# Patient Record
Sex: Male | Born: 1955 | Race: White | Hispanic: No | Marital: Married | Smoking: Former smoker
Health system: Southern US, Community
[De-identification: ages and names within clinical notes are randomized; demographics above are authoritative.]

## PROBLEM LIST (undated history)

## (undated) DIAGNOSIS — I219 Acute myocardial infarction, unspecified: Secondary | ICD-10-CM

## (undated) DIAGNOSIS — S43432D Superior glenoid labrum lesion of left shoulder, subsequent encounter: Secondary | ICD-10-CM

## (undated) DIAGNOSIS — E7800 Pure hypercholesterolemia, unspecified: Secondary | ICD-10-CM

## (undated) HISTORY — PX: CARDIAC CATHETERIZATION: SHX172

## (undated) HISTORY — PX: FRACTURE SURGERY: SHX138

---

## 2001-05-30 ENCOUNTER — Ambulatory Visit (HOSPITAL_BASED_OUTPATIENT_CLINIC_OR_DEPARTMENT_OTHER): Admission: RE | Admit: 2001-05-30 | Discharge: 2001-05-30 | Payer: Self-pay | Admitting: Orthopedic Surgery

## 2002-05-30 ENCOUNTER — Encounter: Payer: Self-pay | Admitting: Orthopedic Surgery

## 2002-05-30 ENCOUNTER — Ambulatory Visit (HOSPITAL_COMMUNITY): Admission: RE | Admit: 2002-05-30 | Discharge: 2002-05-30 | Payer: Self-pay | Admitting: Orthopedic Surgery

## 2002-09-24 ENCOUNTER — Emergency Department (HOSPITAL_COMMUNITY): Admission: EM | Admit: 2002-09-24 | Discharge: 2002-09-24 | Payer: Self-pay | Admitting: Emergency Medicine

## 2004-10-28 ENCOUNTER — Observation Stay (HOSPITAL_COMMUNITY): Admission: EM | Admit: 2004-10-28 | Discharge: 2004-10-29 | Payer: Self-pay | Admitting: Emergency Medicine

## 2004-11-18 ENCOUNTER — Inpatient Hospital Stay (HOSPITAL_BASED_OUTPATIENT_CLINIC_OR_DEPARTMENT_OTHER): Admission: RE | Admit: 2004-11-18 | Discharge: 2004-11-18 | Payer: Self-pay | Admitting: Cardiology

## 2004-12-16 ENCOUNTER — Encounter: Admission: RE | Admit: 2004-12-16 | Discharge: 2004-12-16 | Payer: Self-pay | Admitting: Neurology

## 2013-07-15 ENCOUNTER — Ambulatory Visit: Payer: Self-pay | Admitting: Family Medicine

## 2013-07-15 VITALS — BP 120/80 | HR 85 | Temp 99.2°F | Resp 16 | Ht 74.0 in | Wt 231.0 lb

## 2013-07-15 DIAGNOSIS — L03317 Cellulitis of buttock: Secondary | ICD-10-CM

## 2013-07-15 DIAGNOSIS — L0231 Cutaneous abscess of buttock: Secondary | ICD-10-CM

## 2013-07-15 DIAGNOSIS — M7918 Myalgia, other site: Secondary | ICD-10-CM

## 2013-07-15 DIAGNOSIS — IMO0001 Reserved for inherently not codable concepts without codable children: Secondary | ICD-10-CM

## 2013-07-15 MED ORDER — DOXYCYCLINE HYCLATE 100 MG PO TABS: 100.0000 mg | ORAL_TABLET | Freq: Two times a day (BID) | ORAL | Status: DC

## 2013-07-15 NOTE — Progress Notes (Signed)
Patient ID: Malaquias Lenker MRN: [id number], DOB: 06-21-1955, 58 y.o. Date of Encounter: 07/15/2013, 11:17 AM    PROCEDURE NOTE: Verbal consent obtained. Alcohol prep.  Local anesthesia obtained with 2% lidocaine plain.   18 G needle used to aspirate small amount of sero-purulent drainage.  1 cm incision made with 11 blade along lesion.  Culture taken. No purulence expressed. Lesion explored revealing no loculations. Packed with 1/4 plain packing.  Dressed. Wound care instructions including precautions with patient. Patient tolerated the procedure well. Recheck in 48 hours     Signed, Georgiann Mccoy, PA-C 07/15/2013 11:17 AM

## 2013-07-15 NOTE — Progress Notes (Signed)
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 His hotel room at air conditioning problems and he developed a small pimple on his right gluteal region. This became larger and more painful. He had an associated fever and chills.  Patient started taking Levaquin and eventually on the trip home, the abscess drained. Subsequently ran out of antibiotic and the abscess reaccumulated despite his manipulation and best efforts to get it to drain. It's now gone back to the original size it is quite painful.  Objective: Patient in no acute distress Examination of the right gluteal fold reveals a 5 cm erythematous indurated mass with central block eschar  Please see PA note regarding I&D   assessment. Gluteal abscess, most likely staph  Plan: I&D today, recheck in 48 hours, start doxycycline 100 twice a day x10 days  Signed, Carola Frost.D.

## 2016-06-30 ENCOUNTER — Emergency Department (HOSPITAL_COMMUNITY)
Admission: EM | Admit: 2016-06-30 | Discharge: 2016-06-30 | Disposition: A | Discharge status: Home / Self Care | Payer: BLUE CROSS/BLUE SHIELD | Attending: Emergency Medicine | Admitting: Emergency Medicine

## 2016-06-30 ENCOUNTER — Encounter (HOSPITAL_COMMUNITY): Payer: Self-pay | Admitting: Emergency Medicine

## 2016-06-30 ENCOUNTER — Emergency Department (HOSPITAL_BASED_OUTPATIENT_CLINIC_OR_DEPARTMENT_OTHER)
Admit: 2016-06-30 | Discharge: 2016-06-30 | Disposition: A | Discharge status: Home / Self Care | Payer: BLUE CROSS/BLUE SHIELD | Attending: Emergency Medicine | Admitting: Emergency Medicine

## 2016-06-30 DIAGNOSIS — Z87891 Personal history of nicotine dependence: Secondary | ICD-10-CM | POA: Diagnosis not present

## 2016-06-30 DIAGNOSIS — Y999 Unspecified external cause status: Secondary | ICD-10-CM | POA: Insufficient documentation

## 2016-06-30 DIAGNOSIS — S99912A Unspecified injury of left ankle, initial encounter: Secondary | ICD-10-CM | POA: Diagnosis present

## 2016-06-30 DIAGNOSIS — Y929 Unspecified place or not applicable: Secondary | ICD-10-CM | POA: Diagnosis not present

## 2016-06-30 DIAGNOSIS — Z79899 Other long term (current) drug therapy: Secondary | ICD-10-CM | POA: Insufficient documentation

## 2016-06-30 DIAGNOSIS — I252 Old myocardial infarction: Secondary | ICD-10-CM | POA: Diagnosis not present

## 2016-06-30 DIAGNOSIS — Y939 Activity, unspecified: Secondary | ICD-10-CM | POA: Insufficient documentation

## 2016-06-30 DIAGNOSIS — M79609 Pain in unspecified limb: Secondary | ICD-10-CM | POA: Diagnosis not present

## 2016-06-30 DIAGNOSIS — R600 Localized edema: Secondary | ICD-10-CM | POA: Insufficient documentation

## 2016-06-30 DIAGNOSIS — X58XXXA Exposure to other specified factors, initial encounter: Secondary | ICD-10-CM | POA: Insufficient documentation

## 2016-06-30 DIAGNOSIS — S9002XA Contusion of left ankle, initial encounter: Secondary | ICD-10-CM | POA: Insufficient documentation

## 2016-06-30 DIAGNOSIS — S8012XA Contusion of left lower leg, initial encounter: Secondary | ICD-10-CM

## 2016-06-30 DIAGNOSIS — R609 Edema, unspecified: Secondary | ICD-10-CM

## 2016-06-30 HISTORY — DX: Acute myocardial infarction, unspecified: I21.9

## 2016-06-30 HISTORY — DX: Pure hypercholesterolemia, unspecified: E78.00

## 2016-06-30 NOTE — ED Triage Notes (Signed)
Patient states that he flies a lot and last week started having left lower leg pain and swelling. Patient saw cardiologist today and was sent to Ed to rule out DVT.

## 2016-06-30 NOTE — ED Provider Notes (Signed)
[facility] DEPT Provider Note   CSN: [id number] Arrival date & time: 06/30/16  1355     History   Chief Complaint Chief Complaint  Patient presents with  . sent from cardiologist for possible DVT    HPI Christian Powell is a 61 y.o. male.  The history is provided by the patient.  Leg Pain   This is a new problem. Episode onset: 1 week ago. The problem occurs constantly. The problem has not changed since onset.Pain location: left calf. The quality of the pain is described as aching. The pain is moderate. Associated symptoms include stiffness. Pertinent negatives include no numbness and full range of motion. He has tried nothing for the symptoms. There has been no history of extremity trauma.    Past Medical History:  Diagnosis Date  . High cholesterol   . MI (myocardial infarction)     There are no active problems to display for this patient.   Past Surgical History:  Procedure Laterality Date  . CARDIAC CATHETERIZATION    . FRACTURE SURGERY         Home Medications    Prior to Admission medications   Medication Sig Start Date End Date Taking? Authorizing Provider  doxycycline (VIBRA-TABS) 100 MG tablet Take 1 tablet (100 mg total) by mouth 2 (two) times daily. 07/15/13   Robyn Haber, MD  rosuvastatin (CRESTOR) 20 MG tablet  06/30/16   Historical Provider, MD    Family History No family history on file.  Social History Social History  Substance Use Topics  . Smoking status: Former Research scientist (life sciences)  . Smokeless tobacco: Never Used  . Alcohol use 2.4 oz/week    4 Glasses of wine per week     Allergies   Patient has no known allergies.   Review of Systems Review of Systems  Musculoskeletal: Positive for stiffness.  Neurological: Negative for numbness.  All other systems reviewed and are negative.    Physical Exam Updated Vital Signs BP 145/71   Pulse 61   Temp 97.9 F (36.6 C) (Oral)   Resp 18   Ht 6\' 3"  (1.905 m)   Wt 246 lb (111.6 kg)   SpO2 96%    BMI 30.75 kg/m   Physical Exam  Constitutional: He is oriented to person, place, and time. He appears well-developed and well-nourished. No distress.  HENT:  Head: Normocephalic and atraumatic.  Nose: Nose normal.  Eyes: Conjunctivae are normal.  Neck: Neck supple. No tracheal deviation present.  Cardiovascular: Normal rate and regular rhythm.   Pulmonary/Chest: Effort normal. No respiratory distress.  Abdominal: Soft. He exhibits no distension.  Musculoskeletal:  2+ LLE pitting edema distally with ecchymosis overlying heel and ankle. Tenderness of left calf and posterior knee  Neurological: He is alert and oriented to person, place, and time.  Skin: Skin is warm and dry.  Psychiatric: He has a normal mood and affect.     ED Treatments / Results  Labs (all labs ordered are listed, but only abnormal results are displayed) Labs Reviewed - No data to display  EKG  EKG Interpretation None       Radiology No results found.  Procedures Procedures (including critical care time)  Medications Ordered in ED Medications - No data to display    Initial Impression / Assessment and Plan / ED Course  I have reviewed the triage vital signs and the nursing notes.  Pertinent labs & imaging results that were available during my care of the patient were reviewed by me  and considered in my medical decision making (see chart for details).     61 y.o. male presents with left leg pain after recent flight from [place] with swelling. Concern for DVT clinically and venous doppler ordered. No evidence of DVT, it appears he may have a hematoma in the plane of the gastroc/soleus likely from small sheared blood vessel. This appears to be tracking down the achilles and c/w ecchymosis at heel with localized edema. I recommended compression stocking therapy, NSAIDs, and routine f/u. Doubt heart failure or renal disease as etiology given unilateral nature of findings with focal hematoma and no active  flow. We discussed that this may place him at increased risk of thrombosis when he flies back to [place] for work so he needs to be taking aspirin and wearing compression stockings for ppx.    Final Clinical Impressions(s) / ED Diagnoses   Final diagnoses:  Leg hematoma, left, initial encounter  Peripheral edema    New Prescriptions New Prescriptions   No medications on file     Leo Grosser, MD 07/01/16 0225

## 2016-06-30 NOTE — Progress Notes (Signed)
*  PRELIMINARY RESULTS* Vascular Ultrasound Left lower extremity venous duplex has been completed.  Preliminary findings: No evidence of deep or superficial vein thrombosis or baker's cyst in the left lower extremity.  Approximately 4.4cm hypoechoic fluid collection seen mid posterior calf, interstitial fluid seen at left ankle.  Preliminary results given to Doctor at bedside @ 16:40.   Everrett Coombe 06/30/2016, 4:41 PM

## 2018-07-06 ENCOUNTER — Other Ambulatory Visit: Payer: Self-pay | Admitting: Gastroenterology

## 2018-08-05 ENCOUNTER — Encounter (HOSPITAL_COMMUNITY): Payer: Self-pay

## 2018-08-05 ENCOUNTER — Ambulatory Visit (HOSPITAL_COMMUNITY): Admit: 2018-08-05 | Payer: BLUE CROSS/BLUE SHIELD | Admitting: Gastroenterology

## 2018-08-05 SURGERY — COLONOSCOPY WITH PROPOFOL
Anesthesia: Monitor Anesthesia Care

## 2018-08-18 ENCOUNTER — Other Ambulatory Visit: Payer: Self-pay

## 2018-08-18 ENCOUNTER — Other Ambulatory Visit: Payer: Self-pay | Admitting: Gastroenterology

## 2018-08-18 ENCOUNTER — Ambulatory Visit (HOSPITAL_COMMUNITY)
Admission: RE | Admit: 2018-08-18 | Discharge: 2018-08-18 | Disposition: A | Discharge status: Home / Self Care | Payer: BLUE CROSS/BLUE SHIELD | Source: Ambulatory Visit | Attending: Gastroenterology | Admitting: Gastroenterology

## 2018-08-18 ENCOUNTER — Encounter (HOSPITAL_COMMUNITY): Payer: Self-pay

## 2018-08-18 DIAGNOSIS — R1032 Left lower quadrant pain: Secondary | ICD-10-CM | POA: Insufficient documentation

## 2018-08-18 LAB — POCT I-STAT CREATININE: Creatinine, Ser: 1.1 mg/dL (ref 0.61–1.24)

## 2018-08-18 MED ORDER — IOHEXOL 300 MG/ML  SOLN
100.0000 mL | Freq: Once | INTRAMUSCULAR | Status: AC | PRN
  Administered 2018-08-18: 100 mL via INTRAVENOUS

## 2018-08-18 MED ORDER — SODIUM CHLORIDE (PF) 0.9 % IJ SOLN
INTRAMUSCULAR | Status: AC
  Filled 2018-08-18: qty 50

## 2018-09-14 ENCOUNTER — Other Ambulatory Visit: Payer: Self-pay | Admitting: Gastroenterology

## 2018-11-01 ENCOUNTER — Other Ambulatory Visit (HOSPITAL_COMMUNITY)
Admission: RE | Admit: 2018-11-01 | Discharge: 2018-11-01 | Disposition: A | Discharge status: Home / Self Care | Payer: BC Managed Care – PPO | Source: Ambulatory Visit | Attending: Gastroenterology | Admitting: Gastroenterology

## 2018-11-01 DIAGNOSIS — Z1159 Encounter for screening for other viral diseases: Secondary | ICD-10-CM | POA: Insufficient documentation

## 2018-11-02 LAB — SARS CORONAVIRUS 2 (TAT 6-24 HRS): SARS Coronavirus 2: NEGATIVE

## 2018-11-03 NOTE — Anesthesia Preprocedure Evaluation (Addendum)
Anesthesia Evaluation  Patient identified by MRN, date of birth, ID band Patient awake    Reviewed: Allergy & Precautions, NPO status , Patient's Chart, lab work & pertinent test results  History of Anesthesia Complications Negative for: history of anesthetic complications  Airway Mallampati: II  TM Distance: >3 FB Neck ROM: Full    Dental no notable dental hx.    Pulmonary former smoker,    Pulmonary exam normal        Cardiovascular Exercise Tolerance: Good + Past MI (approx 16 years ago)  Normal cardiovascular exam     Neuro/Psych negative neurological ROS     GI/Hepatic Neg liver ROS, GERD  Medicated and Controlled,  Endo/Other  negative endocrine ROS  Renal/GU negative Renal ROS     Musculoskeletal negative musculoskeletal ROS (+)   Abdominal   Peds  Hematology negative hematology ROS (+)   Anesthesia Other Findings Day of surgery medications reviewed with the patient.  Reproductive/Obstetrics                            Anesthesia Physical Anesthesia Plan  ASA: III  Anesthesia Plan: MAC   Post-op Pain Management:    Induction:   PONV Risk Score and Plan: 1 and Treatment may vary due to age or medical condition and Propofol infusion  Airway Management Planned: Natural Airway and Nasal Cannula  Additional Equipment:   Intra-op Plan:   Post-operative Plan:   Informed Consent: I have reviewed the patients History and Physical, chart, labs and discussed the procedure including the risks, benefits and alternatives for the proposed anesthesia with the patient or authorized representative who has indicated his/her understanding and acceptance.     Dental advisory given  Plan Discussed with: CRNA  Anesthesia Plan Comments:        Anesthesia Quick Evaluation

## 2018-11-04 ENCOUNTER — Other Ambulatory Visit: Payer: Self-pay

## 2018-11-04 ENCOUNTER — Ambulatory Visit (HOSPITAL_COMMUNITY)
Admission: RE | Admit: 2018-11-04 | Discharge: 2018-11-04 | Disposition: A | Discharge status: Home / Self Care | Payer: BC Managed Care – PPO | Attending: Gastroenterology | Admitting: Gastroenterology

## 2018-11-04 ENCOUNTER — Encounter (HOSPITAL_COMMUNITY): Payer: Self-pay | Admitting: Emergency Medicine

## 2018-11-04 ENCOUNTER — Ambulatory Visit (HOSPITAL_COMMUNITY): Payer: BC Managed Care – PPO | Admitting: Anesthesiology

## 2018-11-04 ENCOUNTER — Encounter (HOSPITAL_COMMUNITY)
Admission: RE | Disposition: A | Discharge status: Home / Self Care | Payer: Self-pay | Source: Home / Self Care | Attending: Gastroenterology

## 2018-11-04 DIAGNOSIS — K219 Gastro-esophageal reflux disease without esophagitis: Secondary | ICD-10-CM | POA: Diagnosis not present

## 2018-11-04 DIAGNOSIS — K449 Diaphragmatic hernia without obstruction or gangrene: Secondary | ICD-10-CM | POA: Insufficient documentation

## 2018-11-04 DIAGNOSIS — Z87891 Personal history of nicotine dependence: Secondary | ICD-10-CM | POA: Diagnosis not present

## 2018-11-04 DIAGNOSIS — D128 Benign neoplasm of rectum: Secondary | ICD-10-CM | POA: Diagnosis not present

## 2018-11-04 DIAGNOSIS — D120 Benign neoplasm of cecum: Secondary | ICD-10-CM | POA: Diagnosis not present

## 2018-11-04 DIAGNOSIS — K5730 Diverticulosis of large intestine without perforation or abscess without bleeding: Secondary | ICD-10-CM | POA: Diagnosis not present

## 2018-11-04 DIAGNOSIS — K222 Esophageal obstruction: Secondary | ICD-10-CM | POA: Insufficient documentation

## 2018-11-04 DIAGNOSIS — I252 Old myocardial infarction: Secondary | ICD-10-CM | POA: Insufficient documentation

## 2018-11-04 DIAGNOSIS — K621 Rectal polyp: Secondary | ICD-10-CM | POA: Insufficient documentation

## 2018-11-04 DIAGNOSIS — Z1211 Encounter for screening for malignant neoplasm of colon: Secondary | ICD-10-CM | POA: Insufficient documentation

## 2018-11-04 HISTORY — PX: POLYPECTOMY: SHX5525

## 2018-11-04 HISTORY — PX: COLONOSCOPY WITH PROPOFOL: SHX5780

## 2018-11-04 HISTORY — PX: ESOPHAGOGASTRODUODENOSCOPY (EGD) WITH PROPOFOL: SHX5813

## 2018-11-04 SURGERY — ESOPHAGOGASTRODUODENOSCOPY (EGD) WITH PROPOFOL
Anesthesia: Monitor Anesthesia Care

## 2018-11-04 MED ORDER — PROPOFOL 10 MG/ML IV BOLUS
INTRAVENOUS | Status: DC | PRN
  Administered 2018-11-04: 20 mg via INTRAVENOUS
  Administered 2018-11-04: 30 mg via INTRAVENOUS

## 2018-11-04 MED ORDER — PROPOFOL 500 MG/50ML IV EMUL
INTRAVENOUS | Status: DC | PRN
  Administered 2018-11-04: 125 ug/kg/min via INTRAVENOUS

## 2018-11-04 MED ORDER — GLYCOPYRROLATE 0.2 MG/ML IJ SOLN
INTRAMUSCULAR | Status: DC | PRN
  Administered 2018-11-04: 0.1 mg via INTRAVENOUS

## 2018-11-04 MED ORDER — PROPOFOL 10 MG/ML IV BOLUS
INTRAVENOUS | Status: AC
  Filled 2018-11-04: qty 60

## 2018-11-04 MED ORDER — LACTATED RINGERS IV SOLN
INTRAVENOUS | Status: DC
  Administered 2018-11-04: 1000 mL via INTRAVENOUS

## 2018-11-04 SURGICAL SUPPLY — 25 items

## 2018-11-04 NOTE — Discharge Instructions (Signed)

## 2018-11-04 NOTE — Anesthesia Procedure Notes (Signed)
Procedure Name: MAC Date/Time: 11/04/2018 8:29 AM Performed by: Eben Burow, CRNA Pre-anesthesia Checklist: Patient identified, Emergency Drugs available, Suction available, Patient being monitored and Timeout performed Oxygen Delivery Method: Nasal cannula Dental Injury: Teeth and Oropharynx as per pre-operative assessment

## 2018-11-04 NOTE — Anesthesia Postprocedure Evaluation (Signed)
Anesthesia Post Note  Patient: Kendrix Orman  Procedure(s) Performed: ESOPHAGOGASTRODUODENOSCOPY (EGD) WITH PROPOFOL (N/A ) COLONOSCOPY WITH PROPOFOL (N/A ) POLYPECTOMY     Patient location during evaluation: PACU Anesthesia Type: MAC Level of consciousness: awake and alert Pain management: pain level controlled Vital Signs Assessment: post-procedure vital signs reviewed and stable Respiratory status: spontaneous breathing, nonlabored ventilation and respiratory function stable Cardiovascular status: blood pressure returned to baseline and stable Postop Assessment: no apparent nausea or vomiting Anesthetic complications: no    Last Vitals:  Vitals:   11/04/18 0725 11/04/18 0910  BP: (!) 159/72   Pulse: 60 (!) 56  Resp: 17 11  Temp: 36.9 C   SpO2: 95% 97%    Last Pain:  Vitals:   11/04/18 0910  TempSrc:   PainSc: 0-No pain                 Brennan Bailey

## 2018-11-04 NOTE — H&P (Signed)
  Christian Powell HPI: At this time the patient denies any problems with nausea, vomiting, fevers, chills, abdominal pain, diarrhea, constipation, hematochezia, melena, or dysphagia. The patient's mother was diagnosed around the age of 51. No complaints of chest pain, SOB, MI, or sleep apnea. 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 GERD is a problem for him for the past 20 years, but it is well-controlled with Prevacid. However, he reports that there is a drug-drug interaction with Crestor.   Past Medical History:  Diagnosis Date  . High cholesterol   . MI (myocardial infarction) [facility])     Past Surgical History:  Procedure Laterality Date  . CARDIAC CATHETERIZATION    . FRACTURE SURGERY      History reviewed. No pertinent family history.  Social History:  reports that he has quit smoking. He has never used smokeless tobacco. He reports current alcohol use of about 4.0 standard drinks of alcohol per week. No history on file for drug.  Allergies: No Known Allergies  Medications: Scheduled: Continuous:  No results found for this or any previous visit (from the past 24 hour(s)).   No results found.  ROS:  As stated above in the HPI otherwise negative.  There were no vitals taken for this visit.    PE: Gen: NAD, Alert and Oriented HEENT:  [place]/AT, EOMI Neck: Supple, no LAD Lungs: CTA Bilaterally CV: RRR without M/G/R ABM: Soft, NTND, +BS Ext: No C/C/E  Assessment/Plan: 1) Screening colonoscopy. 2) GERD - EGD.  [name] 11/04/2018, 7:23 AM

## 2018-11-04 NOTE — Op Note (Signed)
[facility] Patient Name: Christian Powell Procedure Date: 11/04/2018 MRN: [id number] Attending MD: Carol Ada , MD Date of Birth: [birth year] CSN: [id number] Age: 63 Admit Type: Outpatient Procedure:                Colonoscopy Indications:              Screening for colorectal malignant neoplasm Providers:                Carol Ada, MD, Baird Cancer, RN, Ashley Jacobs,                            RN, Cletis Athens, Technician Referring MD:              Medicines:                Propofol per Anesthesia Complications:            No immediate complications. Estimated Blood Loss:     Estimated blood loss: none. Procedure:                Pre-Anesthesia Assessment:                           - Prior to the procedure, a History and Physical                            was performed, and patient medications and                            allergies were reviewed. The patient's tolerance of                            previous anesthesia was also reviewed. The risks                            and benefits of the procedure and the sedation                            options and risks were discussed with the patient.                            All questions were answered, and informed consent                            was obtained. Prior Anticoagulants: The patient has                            taken no previous anticoagulant or antiplatelet                            agents. ASA Grade Assessment: II - A patient with                            mild systemic disease. After reviewing the risks  and benefits, the patient was deemed in                            satisfactory condition to undergo the procedure.                           - Deep sedation was attained.                           After obtaining informed consent, the colonoscope                            was passed under direct vision. Throughout the                            procedure, the  patient's blood pressure, pulse, and                            oxygen saturations were monitored continuously. The                            PCF-H190DL ([id number]) Olympus pediatric colonscope                            was introduced through the anus and advanced to the                            the cecum, identified by appendiceal orifice and                            ileocecal valve. The colonoscopy was performed                            without difficulty. The patient tolerated the                            procedure well. The quality of the bowel                            preparation was good. The ileocecal valve,                            appendiceal orifice, and rectum were photographed. Scope In: 8:38:59 AM Scope Out: 9:01:58 AM Scope Withdrawal Time: 0 hours 18 minutes 37 seconds  Total Procedure Duration: 0 hours 22 minutes 59 seconds  Findings:      Six sessile polyps were found in the rectum and cecum. The polyps were 3       to 10 mm in size. These polyps were removed with a cold snare. Resection       and retrieval were complete.      Scattered small and large-mouthed diverticula were found in the sigmoid       colon and descending colon. Impression:               - Six 3 to 10 mm polyps in the  rectum and in the                            cecum, removed with a cold snare. Resected and                            retrieved.                           - Diverticulosis in the sigmoid colon and in the                            descending colon. Moderate Sedation:      Not Applicable - Patient had care per Anesthesia. Recommendation:           - Patient has a contact number available for                            emergencies. The signs and symptoms of potential                            delayed complications were discussed with the                            patient. Return to normal activities tomorrow.                            Written discharge instructions were  provided to the                            patient.                           - Resume previous diet.                           - Continue present medications.                           - Await pathology results.                           - Repeat colonoscopy in 3 years for surveillance. Procedure Code(s):        --- Professional ---                           [phone], Colonoscopy, flexible; with removal of                            tumor(s), polyp(s), or other lesion(s) by snare                            technique Diagnosis Code(s):        --- Professional ---                           Z12.11, Encounter for screening  for malignant                            neoplasm of colon                           K62.1, Rectal polyp                           K63.5, Polyp of colon                           K57.30, Diverticulosis of large intestine without                            perforation or abscess without bleeding CPT copyright 2019 American [employer]. All rights reserved. The codes documented in this report are preliminary and upon coder review may  be revised to meet current compliance requirements. Carol Ada, MD Carol Ada, MD 11/04/2018 9:07:03 AM This report has been signed electronically. Number of Addenda: 0

## 2018-11-04 NOTE — Op Note (Signed)
[facility] Patient Name: Christian Powell Procedure Date: 11/04/2018 MRN: [id number] Attending MD: Carol Ada , MD Date of Birth: [birth year] CSN: [id number] Age: 63 Admit Type: Outpatient Procedure:                Upper GI endoscopy Indications:              Heartburn Providers:                Carol Ada, MD, Baird Cancer, RN, Ashley Jacobs,                            RN, Cletis Athens, Technician Referring MD:              Medicines:                Propofol per Anesthesia Complications:            No immediate complications. Estimated Blood Loss:     Estimated blood loss: none. Procedure:                Pre-Anesthesia Assessment:                           - Prior to the procedure, a History and Physical                            was performed, and patient medications and                            allergies were reviewed. The patient's tolerance of                            previous anesthesia was also reviewed. The risks                            and benefits of the procedure and the sedation                            options and risks were discussed with the patient.                            All questions were answered, and informed consent                            was obtained. Prior Anticoagulants: The patient has                            taken no previous anticoagulant or antiplatelet                            agents. ASA Grade Assessment: II - A patient with                            mild systemic disease. After reviewing the risks  and benefits, the patient was deemed in                            satisfactory condition to undergo the procedure.                           - Sedation was administered by an anesthesia                            professional. Deep sedation was attained.                           After obtaining informed consent, the endoscope was                            passed under direct vision. Throughout  the                            procedure, the patient's blood pressure, pulse, and                            oxygen saturations were monitored continuously. The                            PCF-H190DL ([id number]) Olympus pediatric colonscope                            was introduced through the mouth, and advanced to                            the second part of duodenum. The upper GI endoscopy                            was accomplished without difficulty. The patient                            tolerated the procedure well. Scope In: Scope Out: Findings:      A mild Schatzki ring was found at the gastroesophageal junction.      A 3 cm hiatal hernia was present.      The stomach was normal.      The examined duodenum was normal. Impression:               - Mild Schatzki ring.                           - 3 cm hiatal hernia.                           - Normal stomach.                           - Normal examined duodenum.                           - No specimens collected. Moderate Sedation:  Not Applicable - Patient had care per Anesthesia. Recommendation:           - Patient has a contact number available for                            emergencies. The signs and symptoms of potential                            delayed complications were discussed with the                            patient. Return to normal activities tomorrow.                            Written discharge instructions were provided to the                            patient.                           - Resume previous diet.                           - Continue present medications. Procedure Code(s):        --- Professional ---                           [phone], Esophagogastroduodenoscopy, flexible,                            transoral; diagnostic, including collection of                            specimen(s) by brushing or washing, when performed                            (separate procedure) Diagnosis Code(s):         --- Professional ---                           K22.2, Esophageal obstruction                           K44.9, Diaphragmatic hernia without obstruction or                            gangrene                           R12, Heartburn CPT copyright 2019 American Medical Association. All rights reserved. The codes documented in this report are preliminary and upon coder review may  be revised to meet current compliance requirements. Carol Ada, MD Carol Ada, MD 11/04/2018 9:08:48 AM This report has been signed electronically. Number of Addenda: 0

## 2018-11-04 NOTE — Transfer of Care (Signed)
Immediate Anesthesia Transfer of Care Note  Patient: Christian Powell  Procedure(s) Performed: ESOPHAGOGASTRODUODENOSCOPY (EGD) WITH PROPOFOL (N/A ) COLONOSCOPY WITH PROPOFOL (N/A ) POLYPECTOMY  Patient Location: PACU and Endoscopy Unit  Anesthesia Type:MAC  Level of Consciousness: awake, alert  and oriented  Airway & Oxygen Therapy: Patient Spontanous Breathing and Patient connected to nasal cannula oxygen  Post-op Assessment: Report given to RN and Post -op Vital signs reviewed and stable  Post vital signs: Reviewed and stable  Last Vitals:  Vitals Value Taken Time  BP    Temp    Pulse 56 11/04/18 0909  Resp 11 11/04/18 0909  SpO2 97 % 11/04/18 0909  Vitals shown include unvalidated device data.  Last Pain:  Vitals:   11/04/18 0725  TempSrc: Oral  PainSc: 0-No pain         Complications: No apparent anesthesia complications

## 2019-02-10 ENCOUNTER — Other Ambulatory Visit: Payer: Self-pay | Admitting: Orthopedic Surgery

## 2019-02-10 DIAGNOSIS — M25512 Pain in left shoulder: Secondary | ICD-10-CM

## 2019-02-26 ENCOUNTER — Other Ambulatory Visit: Payer: BC Managed Care – PPO

## 2019-02-27 ENCOUNTER — Ambulatory Visit
Admission: RE | Admit: 2019-02-27 | Discharge: 2019-02-27 | Disposition: A | Discharge status: Home / Self Care | Payer: Self-pay | Source: Ambulatory Visit | Attending: Orthopedic Surgery | Admitting: Orthopedic Surgery

## 2019-02-27 ENCOUNTER — Other Ambulatory Visit: Payer: Self-pay

## 2019-02-27 DIAGNOSIS — M25512 Pain in left shoulder: Secondary | ICD-10-CM

## 2019-03-02 ENCOUNTER — Other Ambulatory Visit (HOSPITAL_COMMUNITY)
Admission: RE | Admit: 2019-03-02 | Discharge: 2019-03-02 | Disposition: A | Discharge status: Home / Self Care | Payer: Medicare Other | Source: Ambulatory Visit | Attending: Orthopedic Surgery | Admitting: Orthopedic Surgery

## 2019-03-02 ENCOUNTER — Encounter (HOSPITAL_BASED_OUTPATIENT_CLINIC_OR_DEPARTMENT_OTHER): Payer: Self-pay | Admitting: *Deleted

## 2019-03-02 ENCOUNTER — Other Ambulatory Visit: Payer: Self-pay

## 2019-03-02 DIAGNOSIS — Z01812 Encounter for preprocedural laboratory examination: Secondary | ICD-10-CM | POA: Insufficient documentation

## 2019-03-02 DIAGNOSIS — Z20828 Contact with and (suspected) exposure to other viral communicable diseases: Secondary | ICD-10-CM | POA: Insufficient documentation

## 2019-03-03 ENCOUNTER — Encounter (HOSPITAL_BASED_OUTPATIENT_CLINIC_OR_DEPARTMENT_OTHER): Payer: Self-pay | Admitting: Physician Assistant

## 2019-03-03 DIAGNOSIS — I219 Acute myocardial infarction, unspecified: Secondary | ICD-10-CM | POA: Diagnosis present

## 2019-03-03 DIAGNOSIS — S43432D Superior glenoid labrum lesion of left shoulder, subsequent encounter: Secondary | ICD-10-CM

## 2019-03-03 DIAGNOSIS — E7800 Pure hypercholesterolemia, unspecified: Secondary | ICD-10-CM | POA: Diagnosis present

## 2019-03-03 NOTE — H&P (Signed)
Christian Powell is an 63 y.o. male.   Chief Complaint: left shoulder pain HPI: Mr. Christian Powell is a 63 year-old seen for follow up evaluation from his significant left shoulder pain.  He has had five months of increasing pain with decreasing range of motion.  He was seen by [place] one month ago and an injection did not help his pain or his range of motion.  He continues to have significant pain with limited range of motion.  He is 17 years status post right shoulder SLAP repair which has done well.   MRI shoulder labral tear with paralabral cyst and AC joint DJD   Past Medical History:  Diagnosis Date  . High cholesterol   . MI (myocardial infarction) [facility])     Past Surgical History:  Procedure Laterality Date  . CARDIAC CATHETERIZATION    . COLONOSCOPY WITH PROPOFOL N/A 11/04/2018   Procedure: COLONOSCOPY WITH PROPOFOL;  Surgeon: Carol Ada, MD;  Location: [facility];  Service: Endoscopy;  Laterality: N/A;  . ESOPHAGOGASTRODUODENOSCOPY (EGD) WITH PROPOFOL N/A 11/04/2018   Procedure: ESOPHAGOGASTRODUODENOSCOPY (EGD) WITH PROPOFOL;  Surgeon: Carol Ada, MD;  Location: [facility];  Service: Endoscopy;  Laterality: N/A;  . FRACTURE SURGERY    . POLYPECTOMY  11/04/2018   Procedure: POLYPECTOMY;  Surgeon: Carol Ada, MD;  Location: [facility];  Service: Endoscopy;;    History reviewed. No pertinent family history. Social History:  reports that he has quit smoking. He has never used smokeless tobacco. He reports current alcohol use of about 4.0 standard drinks of alcohol per week. He reports that he does not use drugs.  Allergies: No Known Allergies  No medications prior to admission.    No results found for this or any previous visit (from the past 48 hour(s)). No results found.  Review of Systems  Constitutional: Negative.   HENT: Negative.   Eyes: Negative.   Respiratory: Negative.   Cardiovascular: Negative.   Gastrointestinal: Negative.   Genitourinary: Negative.    Musculoskeletal: Positive for joint pain.  Skin: Negative.   Neurological: Negative.   Endo/Heme/Allergies: Negative.   Psychiatric/Behavioral: Negative.     Height 6\' 3"  (1.905 m), weight 108.9 kg. Physical Exam  Constitutional: He is oriented to person, place, and time. He appears well-developed and well-nourished.  HENT:  Head: Normocephalic and atraumatic.  Mouth/Throat: Oropharynx is clear and moist.  Eyes: Pupils are equal, round, and reactive to light. EOM are normal.  Neck: Neck supple.  Cardiovascular: Normal rate.  Respiratory: Effort normal.  GI: Soft.  Genitourinary:    Genitourinary Comments: Not pertinent to current symptomatology therefore not examined.   Musculoskeletal:     Comments: Examination of his left shoulder reveals only 70% range of motion with pain.  Weakness on rotator cuff stressing.  Positive impingement.  No instability.  Examination of the right shoulder reveals full range of motion without pain, weakness or instability.  Neurological: He is alert and oriented to person, place, and time.  Skin: Skin is warm and dry.  Psychiatric: He has a normal mood and affect. His behavior is normal.     Assessment Left shoulder labral tear with paralabral cyst Left shoulder AC joint DJD  Plan Left shoulder arthroscopy with partial labral tear debridement, possible biceps tenodesis, subacromial decompression and distal clavicle excision.  The risks, benefits, and possible complications of the procedure were discussed in detail with the patient.  The patient is without question.  Keiasha Diep J Argyle Gustafson, PA-C 03/03/2019, 4:04 PM

## 2019-03-04 LAB — NOVEL CORONAVIRUS, NAA (HOSP ORDER, SEND-OUT TO REF LAB; TAT 18-24 HRS): SARS-CoV-2, NAA: NOT DETECTED

## 2019-03-05 NOTE — Anesthesia Preprocedure Evaluation (Addendum)
Anesthesia Evaluation  Patient identified by MRN, date of birth, ID band Patient awake    Reviewed: Allergy & Precautions, NPO status , Patient's Chart, lab work & pertinent test results  Airway Mallampati: II  TM Distance: >3 FB Neck ROM: Full    Dental no notable dental hx. (+) Teeth Intact, Dental Advisory Given   Pulmonary former smoker,    Pulmonary exam normal breath sounds clear to auscultation       Cardiovascular + CAD and + Past MI  Normal cardiovascular exam Rhythm:Regular Rate:Normal     Neuro/Psych negative neurological ROS  negative psych ROS   GI/Hepatic negative GI ROS, Neg liver ROS,   Endo/Other  negative endocrine ROS  Renal/GU negative Renal ROS     Musculoskeletal   Abdominal   Peds  Hematology negative hematology ROS (+)   Anesthesia Other Findings   Reproductive/Obstetrics                            Anesthesia Physical Anesthesia Plan  ASA: III  Anesthesia Plan: General and Regional   Post-op Pain Management:  Regional for Post-op pain   Induction: Intravenous  PONV Risk Score and Plan: 3 and Treatment may vary due to age or medical condition, Ondansetron and Dexamethasone  Airway Management Planned: Oral ETT  Additional Equipment: None  Intra-op Plan:   Post-operative Plan: Extubation in OR  Informed Consent: I have reviewed the patients History and Physical, chart, labs and discussed the procedure including the risks, benefits and alternatives for the proposed anesthesia with the patient or authorized representative who has indicated his/her understanding and acceptance.     Dental advisory given  Plan Discussed with: CRNA  Anesthesia Plan Comments: (L ISB w GA)       Anesthesia Quick Evaluation

## 2019-03-06 ENCOUNTER — Other Ambulatory Visit: Payer: Self-pay

## 2019-03-06 ENCOUNTER — Ambulatory Visit (HOSPITAL_BASED_OUTPATIENT_CLINIC_OR_DEPARTMENT_OTHER): Payer: Medicare Other | Admitting: Anesthesiology

## 2019-03-06 ENCOUNTER — Encounter (HOSPITAL_BASED_OUTPATIENT_CLINIC_OR_DEPARTMENT_OTHER): Payer: Self-pay | Admitting: *Deleted

## 2019-03-06 ENCOUNTER — Ambulatory Visit (HOSPITAL_BASED_OUTPATIENT_CLINIC_OR_DEPARTMENT_OTHER)
Admission: RE | Admit: 2019-03-06 | Discharge: 2019-03-06 | Disposition: A | Discharge status: Home / Self Care | Payer: Medicare Other | Attending: Orthopedic Surgery | Admitting: Orthopedic Surgery

## 2019-03-06 ENCOUNTER — Encounter (HOSPITAL_BASED_OUTPATIENT_CLINIC_OR_DEPARTMENT_OTHER)
Admission: RE | Disposition: A | Discharge status: Home / Self Care | Payer: Self-pay | Source: Home / Self Care | Attending: Orthopedic Surgery

## 2019-03-06 DIAGNOSIS — I252 Old myocardial infarction: Secondary | ICD-10-CM | POA: Insufficient documentation

## 2019-03-06 DIAGNOSIS — S46112A Strain of muscle, fascia and tendon of long head of biceps, left arm, initial encounter: Secondary | ICD-10-CM | POA: Insufficient documentation

## 2019-03-06 DIAGNOSIS — I2510 Atherosclerotic heart disease of native coronary artery without angina pectoris: Secondary | ICD-10-CM | POA: Diagnosis not present

## 2019-03-06 DIAGNOSIS — X58XXXA Exposure to other specified factors, initial encounter: Secondary | ICD-10-CM | POA: Insufficient documentation

## 2019-03-06 DIAGNOSIS — M7502 Adhesive capsulitis of left shoulder: Secondary | ICD-10-CM | POA: Diagnosis not present

## 2019-03-06 DIAGNOSIS — E7800 Pure hypercholesterolemia, unspecified: Secondary | ICD-10-CM | POA: Diagnosis present

## 2019-03-06 DIAGNOSIS — S46012A Strain of muscle(s) and tendon(s) of the rotator cuff of left shoulder, initial encounter: Secondary | ICD-10-CM | POA: Diagnosis not present

## 2019-03-06 DIAGNOSIS — Z87891 Personal history of nicotine dependence: Secondary | ICD-10-CM | POA: Diagnosis not present

## 2019-03-06 DIAGNOSIS — S43492A Other sprain of left shoulder joint, initial encounter: Secondary | ICD-10-CM | POA: Diagnosis present

## 2019-03-06 DIAGNOSIS — S43432D Superior glenoid labrum lesion of left shoulder, subsequent encounter: Secondary | ICD-10-CM

## 2019-03-06 DIAGNOSIS — M7542 Impingement syndrome of left shoulder: Secondary | ICD-10-CM | POA: Diagnosis not present

## 2019-03-06 DIAGNOSIS — I219 Acute myocardial infarction, unspecified: Secondary | ICD-10-CM | POA: Diagnosis present

## 2019-03-06 HISTORY — PX: SHOULDER ARTHROSCOPY WITH DEBRIDEMENT AND BICEP TENDON REPAIR: SHX5690

## 2019-03-06 HISTORY — PX: LYSIS OF ADHESION: SHX5961

## 2019-03-06 HISTORY — DX: Superior glenoid labrum lesion of left shoulder, subsequent encounter: S43.432D

## 2019-03-06 SURGERY — SHOULDER ARTHROSCOPY WITH DEBRIDEMENT AND BICEP TENDON REPAIR
Anesthesia: Regional | Site: Shoulder | Laterality: Left

## 2019-03-06 MED ORDER — BUPIVACAINE HCL (PF) 0.25 % IJ SOLN
INTRAMUSCULAR | Status: AC
  Filled 2019-03-06: qty 30

## 2019-03-06 MED ORDER — FENTANYL CITRATE (PF) 100 MCG/2ML IJ SOLN
INTRAMUSCULAR | Status: AC
  Filled 2019-03-06: qty 2

## 2019-03-06 MED ORDER — DEXAMETHASONE SODIUM PHOSPHATE 10 MG/ML IJ SOLN
INTRAMUSCULAR | Status: AC
  Filled 2019-03-06: qty 1

## 2019-03-06 MED ORDER — ONDANSETRON HCL 4 MG/2ML IJ SOLN: 4.0000 mg | Freq: Once | INTRAMUSCULAR | Status: DC | PRN

## 2019-03-06 MED ORDER — ROCURONIUM BROMIDE 10 MG/ML (PF) SYRINGE
PREFILLED_SYRINGE | INTRAVENOUS | Status: AC
  Filled 2019-03-06: qty 10

## 2019-03-06 MED ORDER — POVIDONE-IODINE 7.5 % EX SOLN: Freq: Once | CUTANEOUS | Status: DC

## 2019-03-06 MED ORDER — ROCURONIUM BROMIDE 100 MG/10ML IV SOLN
INTRAVENOUS | Status: DC | PRN
  Administered 2019-03-06: 50 mg via INTRAVENOUS

## 2019-03-06 MED ORDER — ONDANSETRON HCL 4 MG/2ML IJ SOLN: INTRAMUSCULAR | Status: DC | PRN

## 2019-03-06 MED ORDER — BUPIVACAINE LIPOSOME 1.3 % IJ SUSP
INTRAMUSCULAR | Status: DC | PRN
  Administered 2019-03-06: 10 mL

## 2019-03-06 MED ORDER — ONDANSETRON HCL 4 MG/2ML IJ SOLN
INTRAMUSCULAR | Status: DC | PRN
  Administered 2019-03-06: 4 mg via INTRAVENOUS

## 2019-03-06 MED ORDER — SUGAMMADEX SODIUM 200 MG/2ML IV SOLN
INTRAVENOUS | Status: DC | PRN
  Administered 2019-03-06: 200 mg via INTRAVENOUS

## 2019-03-06 MED ORDER — DEXAMETHASONE SODIUM PHOSPHATE 10 MG/ML IJ SOLN: 8.0000 mg | Freq: Once | INTRAMUSCULAR | Status: DC

## 2019-03-06 MED ORDER — SODIUM CHLORIDE 0.9 % IR SOLN
Status: DC | PRN
  Administered 2019-03-06: 3000 mL

## 2019-03-06 MED ORDER — CEFAZOLIN SODIUM-DEXTROSE 2-4 GM/100ML-% IV SOLN
2.0000 g | INTRAVENOUS | Status: AC
  Administered 2019-03-06: 2 g via INTRAVENOUS

## 2019-03-06 MED ORDER — MIDAZOLAM HCL 2 MG/2ML IJ SOLN
1.0000 mg | INTRAMUSCULAR | Status: DC | PRN
  Administered 2019-03-06: 2 mg via INTRAVENOUS

## 2019-03-06 MED ORDER — LACTATED RINGERS IV SOLN
INTRAVENOUS | Status: DC
  Administered 2019-03-06 (×2): via INTRAVENOUS

## 2019-03-06 MED ORDER — CEFAZOLIN SODIUM-DEXTROSE 2-4 GM/100ML-% IV SOLN
INTRAVENOUS | Status: AC
  Filled 2019-03-06: qty 100

## 2019-03-06 MED ORDER — TIZANIDINE HCL 2 MG PO CAPS: 2.0000 mg | ORAL_CAPSULE | Freq: Three times a day (TID) | ORAL | 0 refills | Status: DC | PRN

## 2019-03-06 MED ORDER — ONDANSETRON HCL 4 MG/2ML IJ SOLN
INTRAMUSCULAR | Status: AC
  Filled 2019-03-06: qty 2

## 2019-03-06 MED ORDER — PROPOFOL 10 MG/ML IV BOLUS
INTRAVENOUS | Status: DC | PRN
  Administered 2019-03-06: 150 mg via INTRAVENOUS
  Administered 2019-03-06: 20 mg via INTRAVENOUS
  Administered 2019-03-06: 50 mg via INTRAVENOUS

## 2019-03-06 MED ORDER — LACTATED RINGERS IV SOLN: INTRAVENOUS | Status: DC

## 2019-03-06 MED ORDER — FENTANYL CITRATE (PF) 100 MCG/2ML IJ SOLN
50.0000 ug | INTRAMUSCULAR | Status: DC | PRN
  Administered 2019-03-06 (×2): 100 ug via INTRAVENOUS

## 2019-03-06 MED ORDER — LIDOCAINE 2% (20 MG/ML) 5 ML SYRINGE
INTRAMUSCULAR | Status: AC
  Filled 2019-03-06: qty 5

## 2019-03-06 MED ORDER — OXYCODONE HCL 5 MG PO TABS: 5.0000 mg | ORAL_TABLET | ORAL | 0 refills | Status: DC | PRN

## 2019-03-06 MED ORDER — LIDOCAINE HCL (CARDIAC) PF 100 MG/5ML IV SOSY
PREFILLED_SYRINGE | INTRAVENOUS | Status: DC | PRN
  Administered 2019-03-06: 100 mg via INTRAVENOUS

## 2019-03-06 MED ORDER — MIDAZOLAM HCL 2 MG/2ML IJ SOLN
INTRAMUSCULAR | Status: AC
  Filled 2019-03-06: qty 2

## 2019-03-06 MED ORDER — POVIDONE-IODINE 10 % EX SWAB: 2.0000 "application " | Freq: Once | CUTANEOUS | Status: DC

## 2019-03-06 MED ORDER — EPINEPHRINE PF 1 MG/ML IJ SOLN
INTRAMUSCULAR | Status: AC
  Filled 2019-03-06: qty 3

## 2019-03-06 MED ORDER — PROPOFOL 10 MG/ML IV BOLUS
INTRAVENOUS | Status: AC
  Filled 2019-03-06: qty 20

## 2019-03-06 MED ORDER — FENTANYL CITRATE (PF) 100 MCG/2ML IJ SOLN
25.0000 ug | INTRAMUSCULAR | Status: DC | PRN
  Administered 2019-03-06: 25 ug via INTRAVENOUS

## 2019-03-06 MED ORDER — BUPIVACAINE HCL (PF) 0.5 % IJ SOLN
INTRAMUSCULAR | Status: DC | PRN
  Administered 2019-03-06: 14 mL via PERINEURAL

## 2019-03-06 MED ORDER — ACETAMINOPHEN 10 MG/ML IV SOLN: 1000.0000 mg | Freq: Once | INTRAVENOUS | Status: DC | PRN

## 2019-03-06 MED ORDER — DEXAMETHASONE SODIUM PHOSPHATE 4 MG/ML IJ SOLN
INTRAMUSCULAR | Status: DC | PRN
  Administered 2019-03-06: 5 mg via INTRAVENOUS

## 2019-03-06 MED ORDER — EPHEDRINE SULFATE 50 MG/ML IJ SOLN
INTRAMUSCULAR | Status: DC | PRN
  Administered 2019-03-06: 5 mg via INTRAVENOUS
  Administered 2019-03-06: 10 mg via INTRAVENOUS

## 2019-03-06 SURGICAL SUPPLY — 80 items
AID PSTN UNV HD RSTRNT DISP (MISCELLANEOUS) ×1
ANCH SUT SWLK 19.1X4.75 (Anchor) ×1 IMPLANT
ANCHOR SUT BIO SW 4.75X19.1 (Anchor) ×1 IMPLANT
APL SKNCLS STERI-STRIP NONHPOA (GAUZE/BANDAGES/DRESSINGS)
BENZOIN TINCTURE PRP APPL 2/3 (GAUZE/BANDAGES/DRESSINGS) IMPLANT
BLADE EXCALIBUR 4.0X13 (MISCELLANEOUS) IMPLANT
BLADE SURG 15 STRL LF DISP TIS (BLADE) IMPLANT
BLADE SURG 15 STRL SS (BLADE)
BNDG COHESIVE 4X5 TAN STRL (GAUZE/BANDAGES/DRESSINGS) ×2 IMPLANT
BURR OVAL 8 FLU 5.0X13 (MISCELLANEOUS) ×2 IMPLANT
CANNULA TWIST IN 8.25X7CM (CANNULA) ×1 IMPLANT
COVER WAND RF STERILE (DRAPES) IMPLANT
DECANTER SPIKE VIAL GLASS SM (MISCELLANEOUS) IMPLANT
DISSECTOR  3.8MM X 13CM (MISCELLANEOUS) ×1
DISSECTOR 3.8MM X 13CM (MISCELLANEOUS) ×1 IMPLANT
DRAPE HALF SHEET 70X43 (DRAPES) IMPLANT
DRAPE SHOULDER BEACH CHAIR (DRAPES) ×2 IMPLANT
DRAPE U-SHAPE 47X51 STRL (DRAPES) ×4 IMPLANT
DRSG PAD ABDOMINAL 8X10 ST (GAUZE/BANDAGES/DRESSINGS) ×2 IMPLANT
DURAPREP 26ML APPLICATOR (WOUND CARE) ×2 IMPLANT
ELECT REM PT RETURN 9FT ADLT (ELECTROSURGICAL) ×2
ELECTRODE REM PT RTRN 9FT ADLT (ELECTROSURGICAL) ×1 IMPLANT
GAUZE SPONGE 4X4 12PLY STRL (GAUZE/BANDAGES/DRESSINGS) ×2 IMPLANT
GAUZE XEROFORM 1X8 LF (GAUZE/BANDAGES/DRESSINGS) ×2 IMPLANT
GLOVE BIO SURGEON STRL SZ7 (GLOVE) IMPLANT
GLOVE BIOGEL PI IND STRL 7.0 (GLOVE) IMPLANT
GLOVE BIOGEL PI IND STRL 7.5 (GLOVE) ×1 IMPLANT
GLOVE BIOGEL PI INDICATOR 7.0 (GLOVE) ×2
GLOVE BIOGEL PI INDICATOR 7.5 (GLOVE) ×1
GLOVE ECLIPSE 6.5 STRL STRAW (GLOVE) ×2 IMPLANT
GLOVE SS BIOGEL STRL SZ 7.5 (GLOVE) ×1 IMPLANT
GLOVE SUPERSENSE BIOGEL SZ 7.5 (GLOVE) ×1
GOWN STRL REUS W/ TWL LRG LVL3 (GOWN DISPOSABLE) ×2 IMPLANT
GOWN STRL REUS W/ TWL XL LVL3 (GOWN DISPOSABLE) ×1 IMPLANT
GOWN STRL REUS W/TWL LRG LVL3 (GOWN DISPOSABLE) ×4
GOWN STRL REUS W/TWL XL LVL3 (GOWN DISPOSABLE) ×2
KIT BIO-TENODESIS 3X8 DISP (MISCELLANEOUS)
KIT INSRT BABSR STRL DISP BTN (MISCELLANEOUS) ×1 IMPLANT
KIT PUSHLOCK 2.9 HIP (KITS) IMPLANT
LOOP 2 FIBERLINK CLOSED (SUTURE) IMPLANT
MANIFOLD NEPTUNE II (INSTRUMENTS) ×2 IMPLANT
NDL SAFETY ECLIPSE 18X1.5 (NEEDLE) ×1 IMPLANT
NDL SCORPION MULTI FIRE (NEEDLE) IMPLANT
NEEDLE HYPO 18GX1.5 SHARP (NEEDLE) ×2
NEEDLE SCORPION MULTI FIRE (NEEDLE) IMPLANT
PACK ARTHROSCOPY DSU (CUSTOM PROCEDURE TRAY) ×2 IMPLANT
PACK BASIN DAY SURGERY FS (CUSTOM PROCEDURE TRAY) ×2 IMPLANT
PAD ALCOHOL SWAB (MISCELLANEOUS) ×4 IMPLANT
PASSER SUT SWIFTSTITCH HIP CRT (INSTRUMENTS) ×1 IMPLANT
PORT APPOLLO RF 90DEGREE MULTI (SURGICAL WAND) ×2 IMPLANT
RESTRAINT HEAD UNIVERSAL NS (MISCELLANEOUS) ×2 IMPLANT
SLEEVE SCD COMPRESS KNEE MED (MISCELLANEOUS) ×1 IMPLANT
SLING ARM FOAM STRAP LRG (SOFTGOODS) IMPLANT
SLING ARM IMMOBILIZER XL (CAST SUPPLIES) ×1 IMPLANT
SLING ULTRA III MED (ORTHOPEDIC SUPPLIES) IMPLANT
SPONGE LAP 4X18 RFD (DISPOSABLE) IMPLANT
STRIP CLOSURE SKIN 1/2X4 (GAUZE/BANDAGES/DRESSINGS) IMPLANT
SUCTION FRAZIER HANDLE 10FR (MISCELLANEOUS)
SUCTION TUBE FRAZIER 10FR DISP (MISCELLANEOUS) IMPLANT
SUT ETHILON 3 0 PS 1 (SUTURE) ×2 IMPLANT
SUT FIBERWIRE #2 38 T-5 BLUE (SUTURE)
SUT PDS AB 2-0 CT2 27 (SUTURE) IMPLANT
SUT PROLENE 3 0 PS 2 (SUTURE) IMPLANT
SUT TIGER TAPE 7 IN WHITE (SUTURE) IMPLANT
SUT VIC AB 0 SH 27 (SUTURE) IMPLANT
SUT VIC AB 2-0 PS2 27 (SUTURE) IMPLANT
SUT VIC AB 2-0 SH 27 (SUTURE)
SUT VIC AB 2-0 SH 27XBRD (SUTURE) IMPLANT
SUTURE FIBERWR #2 38 T-5 BLUE (SUTURE) IMPLANT
SUTURE TAPE TIGERLINK 1.3MM BL (SUTURE) IMPLANT
SUTURETAPE TIGERLINK 1.3MM BL (SUTURE) ×2
SYR 5ML LL (SYRINGE) ×2 IMPLANT
SYR BULB 3OZ (MISCELLANEOUS) IMPLANT
TAPE FIBER 2MM 7IN #2 BLUE (SUTURE) IMPLANT
TAPE HYPAFIX 6X30 (GAUZE/BANDAGES/DRESSINGS) IMPLANT
TAPE STRIPS DRAPE STRL (GAUZE/BANDAGES/DRESSINGS) ×2 IMPLANT
TOWEL GREEN STERILE FF (TOWEL DISPOSABLE) ×2 IMPLANT
TUBE CONNECTING 20X1/4 (TUBING) ×2 IMPLANT
TUBING ARTHROSCOPY IRRIG 16FT (MISCELLANEOUS) ×2 IMPLANT
WATER STERILE IRR 1000ML POUR (IV SOLUTION) ×2 IMPLANT

## 2019-03-06 NOTE — Anesthesia Procedure Notes (Signed)
Anesthesia Regional Block: Interscalene brachial plexus block   Pre-Anesthetic Checklist: ,, timeout performed, Correct Patient, Correct Site, Correct Laterality, Correct Procedure, Correct Position, site marked, Risks and benefits discussed,  Surgical consent,  Pre-op evaluation,  At surgeon's request and post-op pain management  Laterality: Upper and Left  Prep: Maximum Sterile Barrier Precautions used, chloraprep       Needles:  Injection technique: Single-shot  Needle Type: Echogenic Needle     Needle Length: 5cm  Needle Gauge: 21     Additional Needles:   Procedures:,,,, ultrasound used (permanent image in chart),,,,  Narrative:  Start time: 03/06/2019 9:02 AM End time: 03/06/2019 9:09 AM Injection made incrementally with aspirations every 5 mL.  Performed by: Personally  Anesthesiologist: Barnet Glasgow, MD  Additional Notes: Block assessed prior to procedure. Patient tolerated procedure well.

## 2019-03-06 NOTE — Anesthesia Postprocedure Evaluation (Signed)
Anesthesia Post Note  Patient: Christian Powell  Procedure(s) Performed: SHOULDER ARTHROSCOPY WITH DEBRIDEMENT AND BICEP TENDODESIS (Left Shoulder) MANIPULATION WITH LYSIS OF ADHESION (Left Shoulder)     Patient location during evaluation: PACU Anesthesia Type: Regional and General Level of consciousness: awake and alert Pain management: pain level controlled Vital Signs Assessment: post-procedure vital signs reviewed and stable Respiratory status: spontaneous breathing, nonlabored ventilation, respiratory function stable and patient connected to nasal cannula oxygen Cardiovascular status: blood pressure returned to baseline and stable Postop Assessment: no apparent nausea or vomiting Anesthetic complications: no    Last Vitals:  Vitals:   03/06/19 1200 03/06/19 1215  BP: 125/80 131/78  Pulse: 69 64  Resp: 20 16  Temp:  36.4 C  SpO2: 96% 96%    Last Pain:  Vitals:   03/06/19 1200  TempSrc:   PainSc: 4                  Barnet Glasgow

## 2019-03-06 NOTE — Anesthesia Procedure Notes (Signed)
Procedure Name: Intubation Date/Time: 03/06/2019 10:38 AM Performed by: Maryella Shivers, CRNA Pre-anesthesia Checklist: Patient identified, Emergency Drugs available, Suction available and Patient being monitored Patient Re-evaluated:Patient Re-evaluated prior to induction Oxygen Delivery Method: Circle system utilized Preoxygenation: Pre-oxygenation with 100% oxygen Induction Type: IV induction Ventilation: Mask ventilation without difficulty Laryngoscope Size: Mac and 4 Grade View: Grade I Tube type: Oral Tube size: 8.0 mm Number of attempts: 1 Airway Equipment and Method: Stylet and Oral airway Placement Confirmation: ETT inserted through vocal cords under direct vision,  positive ETCO2 and breath sounds checked- equal and bilateral Secured at: 22 cm Tube secured with: Tape Dental Injury: Teeth and Oropharynx as per pre-operative assessment

## 2019-03-06 NOTE — Op Note (Signed)
NAMEDERLY, HODGKISS MEDICAL RECORD [id number] ACCOUNT [id number] DATE OF BIRTH:Nov 14, 1955 FACILITY: [facility] LOCATION: [facility] PHYSICIAN:Tine Mabee Venetia Maxon, MD  OPERATIVE REPORT  DATE OF PROCEDURE:  03/06/2019  PREOPERATIVE DIAGNOSES:   1.  Left shoulder acute traumatic partial labrum tear, partial rotator cuff tear, partial biceps tendon tear. 2.  Left shoulder chronic nontraumatic impingement. 3.  Left shoulder adhesive capsulitis.  POSTOPERATIVE DIAGNOSES: 1.  Left shoulder acute traumatic partial labrum tear, partial rotator cuff tear, partial biceps tendon tear. 2.  Left shoulder chronic nontraumatic impingement. 3.  Left shoulder adhesive capsulitis.  PROCEDURES PERFORMED: 1.  Left shoulder exam under anesthesia, followed by manipulation with arthroscopic lysis of adhesions. 2.  Left shoulder debridement, partial rotator cuff, partial labrum, partial biceps tendon tear -- extensive. 3.  Left shoulder subacromial decompression.  SURGEON:   Elsie Saas, MD   ASSISTANT:  Matthew Saras, PA.  ANESTHESIA:  General.  OPERATIVE TIME:  One hour.  COMPLICATIONS:  None.  INDICATIONS:  The patient is a 63 year old gentleman who has had 8-10 months of increasing left shoulder pain with repetitive trauma, but no history of dislocation with significant posttraumatic adhesive capsulitis.  Exam, MRI and x-rays have revealed  adhesive capsulitis, partial labrum, partial rotator cuff tear, partial biceps tendon tear with impingement.  He has failed conservative care and is now to undergo arthroscopy.  DESCRIPTION OF PROCEDURE:  The patient was brought to the operating room on 03/06/2019 after an interscalene block was placed in the holding room by anesthesia.  He was placed on the operating table in supine position.  He received antibiotics  preoperatively for prophylaxis.  After being placed under general anesthesia, his left shoulder was examined.  He had forward flexion of 160  degrees, abduction 160 degrees, internal and external rotation of 70 degrees.  A gentle manipulation was carried  out, breaking up adhesions and improving range of motion to full range of motion.  Shoulder remained stable.  He was then placed in the beach chair position and his shoulder and arm was prepped and using sterile DuraPrep and draped using sterile  technique.  Timeout procedure was called and the correct left shoulder identified.  Initially through a posterior arthroscopic portal, the arthroscope with a pump attached was placed in through an anterior portal, an arthroscopic probe was placed.  On  initial inspection, the articular cartilage in the glenohumeral joint was intact.  He had partial tearing of the anterior, inferior, posterior and superior labrum, 25-30%, which was debrided.  This was circumferential tearing, but no significant  instability.  This was debrided back to a stable base.  The superior labrum and biceps tendon anchor was torn and the biceps tendon was partially torn and this was felt to be amenable to a biceps tenodesis.  The rotator cuff showed partial tearing 20% of  the supraspinatus, which was debrided, but the rest of the rotator cuff was intact.  Inferior capsular recess showed adhesions and synovitis and lysis of adhesions was carried out and cautery was used for the bleeders.  After this was done, then the  biceps tenodesis was carried out using the Arthrex loop and tack technique with the Arthrex FiberTape sutures placed through the biceps tendon in a locking technique and then the medial aspect of the biceps was cut and the superior labrum was debrided  back to a stable rim and a stable base.  The tenodesis was then carried out using Arthrex SwiveLock anchor in the intraarticular portion of the bicipital groove with firm  and tight fixation.  At this point then, the partial rotator cuff tear debrided as  well.  At this point, subacromial space was entered and a lateral  arthroscopic portal was made.  A large amount of bursitis was resected.  The rotator cuff was frayed on the bursal surface, but no evidence of a tear.  Impingement was noted as the  undersurface of the acromion was digging into the rotator cuff and a subacromial decompression was carried out, removing 6-8 mm of the undersurface of the anterior, anterolateral and anteromedial acromion and CA ligament release carried out as well.  The  [facility] joint was not pathologic and thus was not resected.  At this point, it was felt that all pathology had been satisfactorily addressed.  The instruments were removed.  Portals were closed with 3-0 nylon suture.  Sterile dressings were applied and a  sling and the patient awakened and taken to recovery room in stable condition.    FOLLOWUP CARE:  The patient will be followed as an outpatient on oxycodone and Flexeril.  I will see him back in the office in a week for sutures out and followup.  VN/NUANCE  D:03/06/2019 T:03/06/2019 JOB:[id number]

## 2019-03-06 NOTE — Interval H&P Note (Signed)
History and Physical Interval Note:  03/06/2019 9:02 AM  Christian Powell  has presented today for surgery, with the diagnosis of LEFT SHOULDER CARTILAGE DISTORDER, ADHESIVE CAPSULITIS BICEPITAL TENDONITIS.  The various methods of treatment have been discussed with the patient and family. After consideration of risks, benefits and other options for treatment, the patient has consented to  Procedure(s): SHOULDER ARTHROSCOPY WITH DEBRIDEMENT AND POSSIBLE BICEP TENDODESIS (Left) MANIPULATION WITH LYSIS OF ADHESION (Left) as a surgical intervention.  The patient's history has been reviewed, patient examined, no change in status, stable for surgery.  I have reviewed the patient's chart and labs.  Questions were answered to the patient's satisfaction.     Lorn Junes

## 2019-03-06 NOTE — Progress Notes (Signed)
Assisted Dr. Valma Cava with left, ultrasound guided, interscalene  block. Side rails up, monitors on throughout procedure. See vital signs in flow sheet. Tolerated Procedure well.

## 2019-03-06 NOTE — Discharge Instructions (Signed)

## 2019-03-06 NOTE — Transfer of Care (Signed)
Immediate Anesthesia Transfer of Care Note  Patient: Christian Powell  Procedure(s) Performed: SHOULDER ARTHROSCOPY WITH DEBRIDEMENT AND BICEP TENDODESIS (Left Shoulder) MANIPULATION WITH LYSIS OF ADHESION (Left Shoulder)  Patient Location: PACU  Anesthesia Type:GA combined with regional for post-op pain  Level of Consciousness: sedated  Airway & Oxygen Therapy: Patient Spontanous Breathing and Patient connected to nasal cannula oxygen  Post-op Assessment: Report given to RN and Post -op Vital signs reviewed and stable  Post vital signs: Reviewed and stable  Last Vitals:  Vitals Value Taken Time  BP 165/61 03/06/19 1136  Temp    Pulse 77 03/06/19 1139  Resp 11 03/06/19 1139  SpO2 96 % 03/06/19 1139  Vitals shown include unvalidated device data.  Last Pain:  Vitals:   03/06/19 0910  TempSrc:   PainSc: 0-No pain      Patients Stated Pain Goal: 3 ([id number])  Complications: No apparent anesthesia complications

## 2019-03-07 ENCOUNTER — Encounter (HOSPITAL_BASED_OUTPATIENT_CLINIC_OR_DEPARTMENT_OTHER): Payer: Self-pay | Admitting: Orthopedic Surgery

## 2020-07-14 IMAGING — MR MR SHOULDER*L* W/O CM
5 series · 34 of 40 positions shown · non-contrast
Comparison: None.

CLINICAL DATA: Left shoulder pain and weakness is October 2018,
limited range of motion increased pain in adduction

EXAM:
MRI OF THE LEFT SHOULDER WITHOUT CONTRAST
TECHNIQUE: Multiplanar, multisequence MR imaging of the shoulder was performed.
No intravenous contrast was administered.

[Series 3: PD fat-sat · axial · 4.0mm · 0.55mm/px · z∈[-32,+53]mm · 6 of 20 slices shown]
[im 1/20]
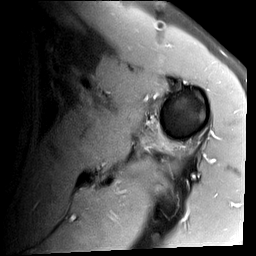
[im 4/20]
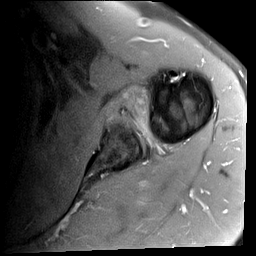
[im 8/20]
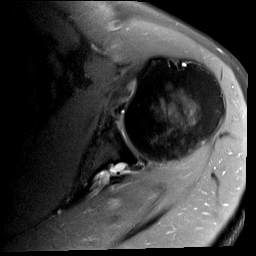
[im 12/20]
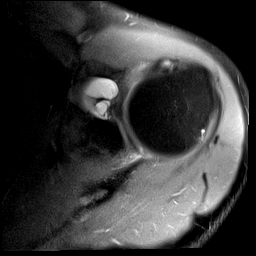
[im 16/20]
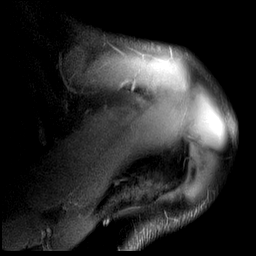
[im 20/20]
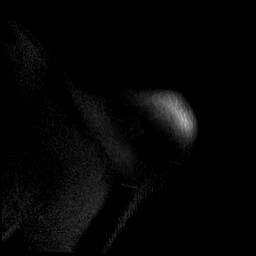

[Series 4: T1 · coronal · 4.0mm · 0.27mm/px · 9 of 24 slices shown]
[im 1/24]
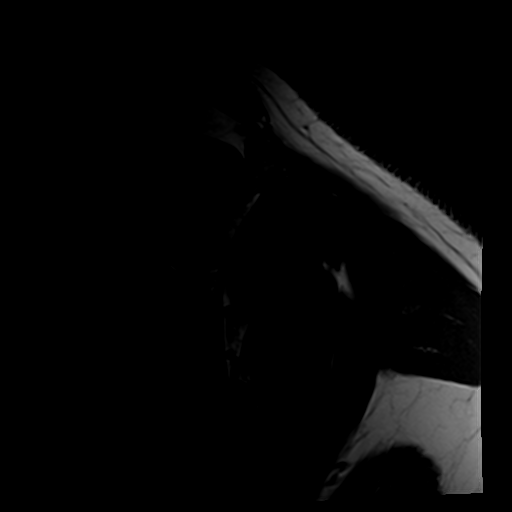
[im 3/24]
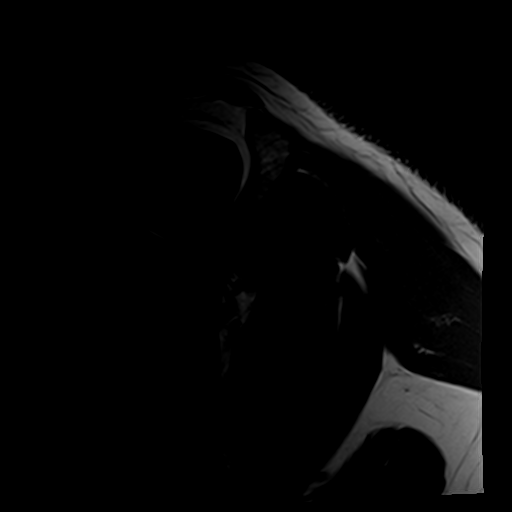
[im 6/24]
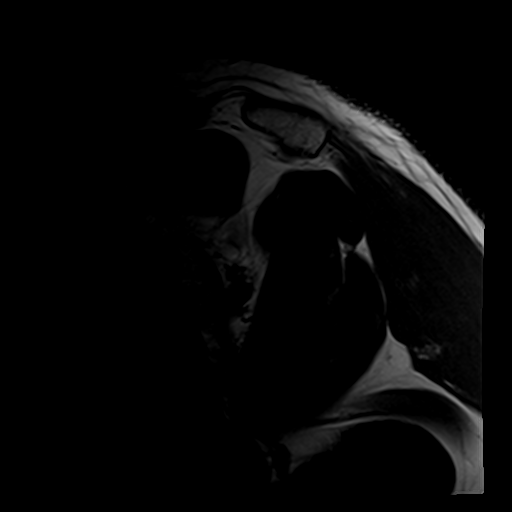
[im 9/24]
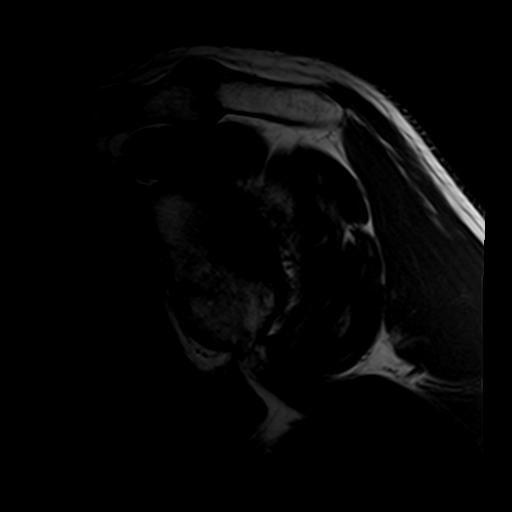
[im 12/24]
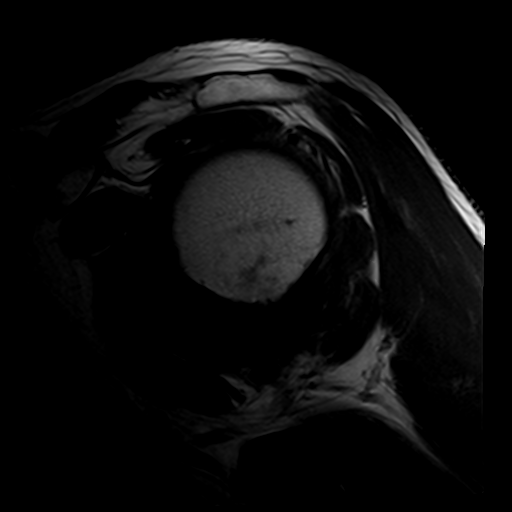
[im 15/24]
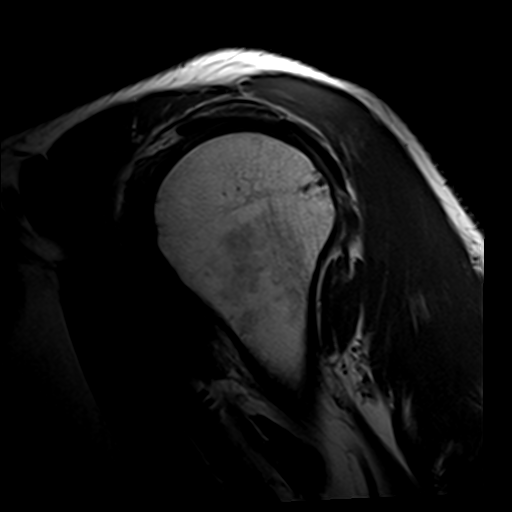
[im 18/24]
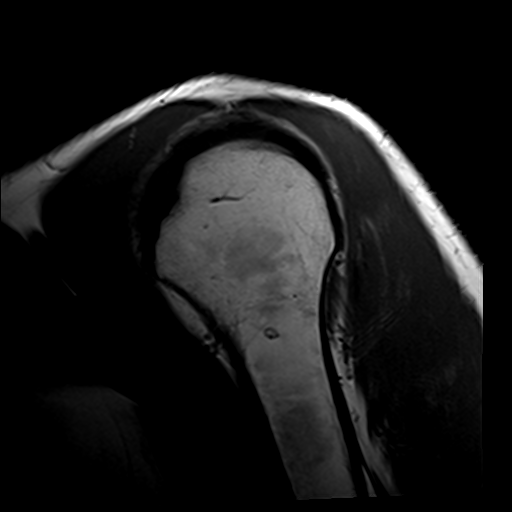
[im 21/24]
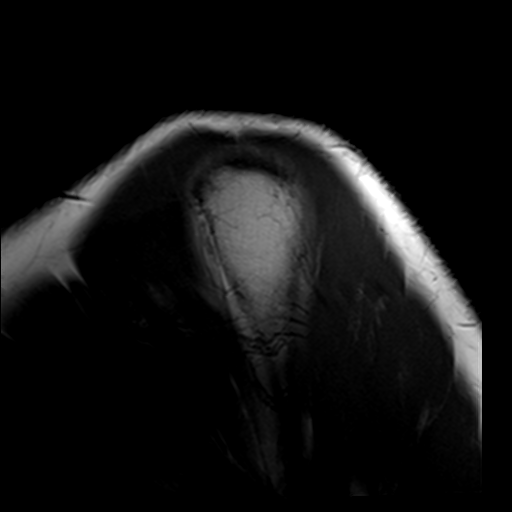
[im 24/24]
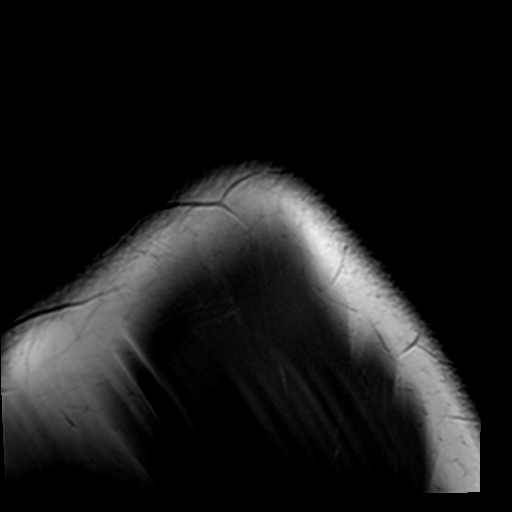

[Series 5: T2 fat-sat · coronal · 4.0mm · 0.55mm/px · 9 of 24 slices shown]
[im 1/24]
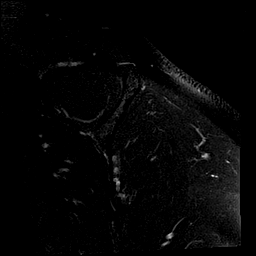
[im 3/24]
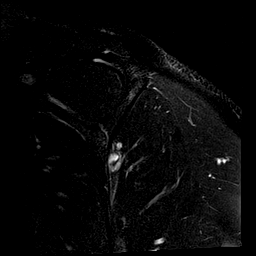
[im 6/24]
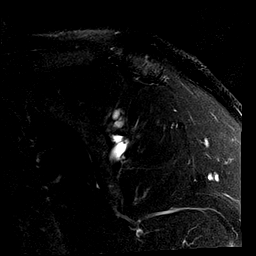
[im 9/24]
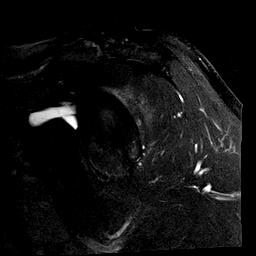
[im 12/24]
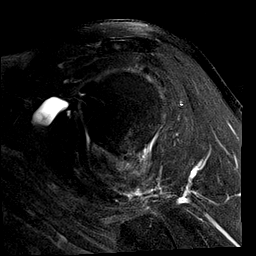
[im 15/24]
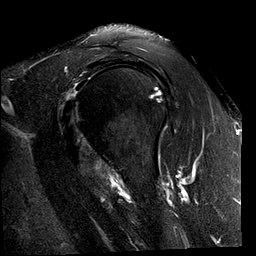
[im 18/24]
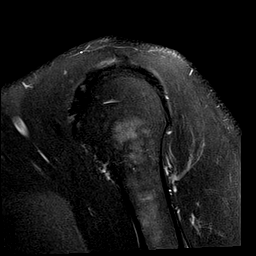
[im 21/24]
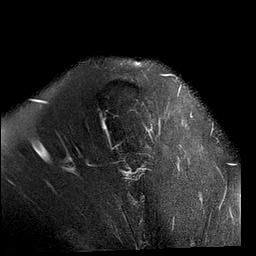
[im 24/24]
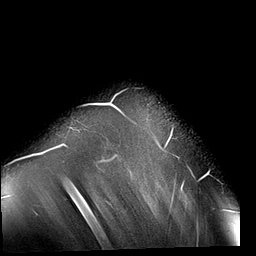

[Series 7: (id) fs · oblique · 4.0mm · 0.55mm/px · 8 of 22 slices shown]
[im 1/22]
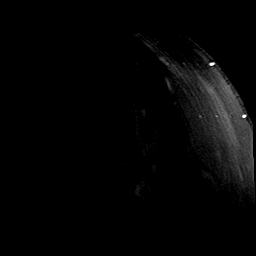
[im 4/22]
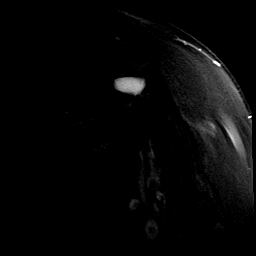
[im 7/22]
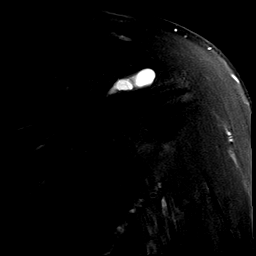
[im 10/22]
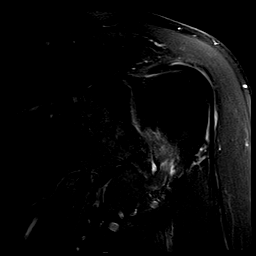
[im 13/22]
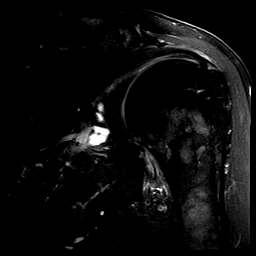
[im 16/22]
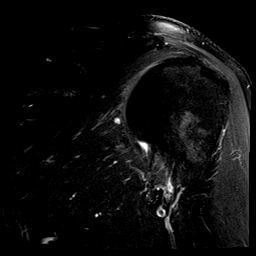
[im 19/22]
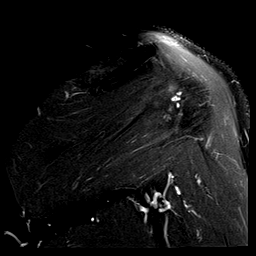
[im 22/22]
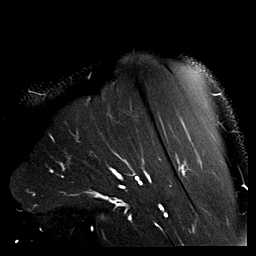

[Series 9: corpd fs · oblique · 4.0mm · 0.27mm/px · 2 of 22 slices shown]
[im 1/22]
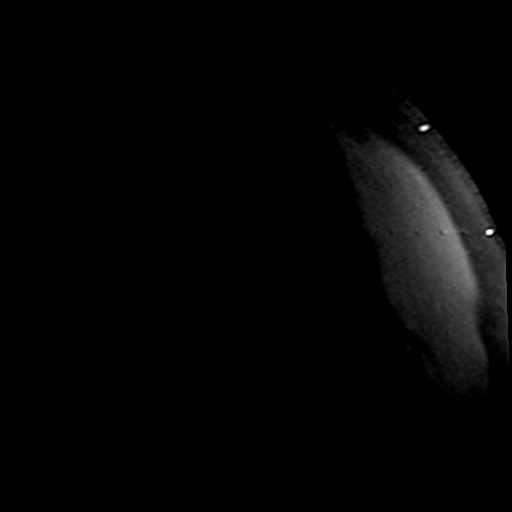
[im 4/22]
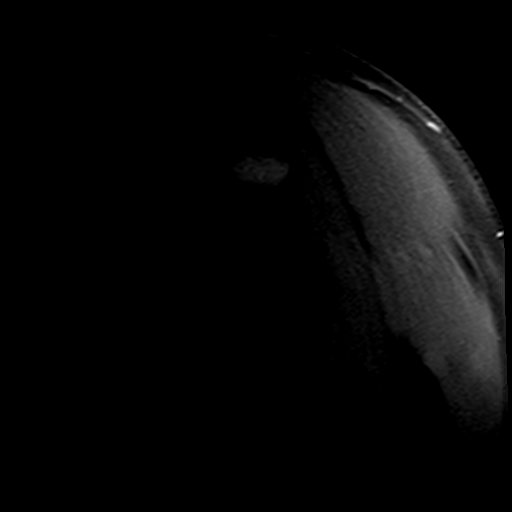

[34 of 40 positions shown; findings below may reference images not displayed]

FINDINGS: Rotator cuff: Increased intrasubstance signal seen at the insertion
site of the supraspinatus and the subscapularis tendon. The
infraspinatus and teres minor tendons are intact. No rotator cuff
full-thickness tears are seen. The muscles of the rotator cuff are
normal without tear, edema, or atrophy.

Muscles: The muscles other than the rotator cuff are normal without
tear, edema, or atrophy.

Biceps Long Head: The Intraarticular and extraarticular portions of
the biceps tendon are normal in position, size and signal.

Acromioclavicular Joint: Moderate AC joint arthrosis is seen with
joint space loss. Type II acromion.

Glenohumeral Joint: Mild glenohumeral joint chondral thinning is
noted. The glenohumeral joint alignment is well maintained. There is
a trace glenohumeral joint effusion. Edema seen around the inferior
capsule.

Labrum: There is attenuation and heterogeneous signal seen in the
anterior superior labrum. There is a nondisplaced tear seen
throughout the entirety of the inferior labrum. There is also a
nondisplaced tear seen of the posteroinferior labrum with a T2
bright cystic lesion seen extending from a the posterior labrum
measuring approximately 2.6 x 0.8 cm, best seen on series 3, image
14 and likely represents a paralabral cyst.

Also extending at the anterior superior portion of the joint space
there is a T2 bright lobular cystic lesion measuring approximately
2.6 x 2.4 cm which could represent a paralabral cyst versus ganglion
cyst.

Bones: No fracture, osteonecrosis, or pathologic marrow
infiltration.

Other: A trace amount of fluid is seen in subacromial-subdeltoid
bursa.
IMPRESSION: 1. Mild insertional supraspinatus and subscapularis tendinosis. No
rotator cuff tears.
2. Mild glenohumeral joint chondral disease
3. Anterior superior labral degeneration with nondisplaced tear of
the entirety of the inferior labrum. Extending from the
posteroinferior labral tear there is a paralabral cyst measuring
x 0.8 cm,
4. Also adjacent to the anterior superior labrum there is a 2.6 x
2.4 cm cystic lesion which could represent a paralabral cyst versus
ganglion cyst.
5. Trace glenohumeral joint effusion with edema around the inferior
capsule which could be due to mild capsulitis.
6. Moderate AC joint arthrosis and trace subacromial-subdeltoid
bursitis.

## 2021-11-21 ENCOUNTER — Other Ambulatory Visit: Payer: Self-pay | Admitting: Gastroenterology

## 2022-01-30 ENCOUNTER — Encounter (HOSPITAL_COMMUNITY): Payer: Self-pay | Admitting: Gastroenterology

## 2022-01-30 NOTE — Progress Notes (Signed)
Attempted to obtain medical history via telephone, unable to reach at this time. HIPAA compliant voicemail message left requesting return call to pre surgical testing department. 

## 2022-02-04 ENCOUNTER — Encounter (HOSPITAL_COMMUNITY): Payer: Self-pay | Admitting: Gastroenterology

## 2022-02-05 NOTE — Anesthesia Preprocedure Evaluation (Addendum)
Anesthesia Evaluation  Patient identified by MRN, date of birth, ID band Patient awake    Reviewed: Allergy & Precautions, NPO status , Patient's Chart, lab work & pertinent test results  Airway Mallampati: II  TM Distance: >3 FB Neck ROM: Full    Dental  (+) Dental Advisory Given, Teeth Intact   Pulmonary neg pulmonary ROS, former smoker,    Pulmonary exam normal breath sounds clear to auscultation       Cardiovascular + Past MI (remote hx, no intervention)  Normal cardiovascular exam Rhythm:Regular Rate:Normal     Neuro/Psych negative neurological ROS  negative psych ROS   GI/Hepatic Neg liver ROS, GERD  Controlled and Medicated,  Endo/Other  negative endocrine ROS  Renal/GU negative Renal ROS  negative genitourinary   Musculoskeletal negative musculoskeletal ROS (+)   Abdominal   Peds  Hematology negative hematology ROS (+)   Anesthesia Other Findings   Reproductive/Obstetrics negative OB ROS                            Anesthesia Physical Anesthesia Plan  ASA: 2  Anesthesia Plan: MAC   Post-op Pain Management:    Induction:   PONV Risk Score and Plan: 2 and Propofol infusion and TIVA  Airway Management Planned: Natural Airway and Simple Face Mask  Additional Equipment: None  Intra-op Plan:   Post-operative Plan:   Informed Consent: I have reviewed the patients History and Physical, chart, labs and discussed the procedure including the risks, benefits and alternatives for the proposed anesthesia with the patient or authorized representative who has indicated his/her understanding and acceptance.       Plan Discussed with: CRNA  Anesthesia Plan Comments:        Anesthesia Quick Evaluation

## 2022-02-06 ENCOUNTER — Encounter (HOSPITAL_COMMUNITY)
Admission: RE | Disposition: A | Discharge status: Home / Self Care | Payer: Self-pay | Source: Home / Self Care | Attending: Gastroenterology

## 2022-02-06 ENCOUNTER — Ambulatory Visit (HOSPITAL_COMMUNITY)
Admission: RE | Admit: 2022-02-06 | Discharge: 2022-02-06 | Disposition: A | Discharge status: Home / Self Care | Payer: Medicare Other | Attending: Gastroenterology | Admitting: Gastroenterology

## 2022-02-06 ENCOUNTER — Ambulatory Visit (HOSPITAL_COMMUNITY): Payer: Medicare Other | Admitting: Anesthesiology

## 2022-02-06 ENCOUNTER — Encounter (HOSPITAL_COMMUNITY): Payer: Self-pay | Admitting: Gastroenterology

## 2022-02-06 ENCOUNTER — Ambulatory Visit (HOSPITAL_BASED_OUTPATIENT_CLINIC_OR_DEPARTMENT_OTHER): Payer: Medicare Other | Admitting: Anesthesiology

## 2022-02-06 ENCOUNTER — Other Ambulatory Visit: Payer: Self-pay

## 2022-02-06 DIAGNOSIS — Z87891 Personal history of nicotine dependence: Secondary | ICD-10-CM | POA: Insufficient documentation

## 2022-02-06 DIAGNOSIS — K635 Polyp of colon: Secondary | ICD-10-CM | POA: Diagnosis not present

## 2022-02-06 DIAGNOSIS — Z86010 Personal history of colonic polyps: Secondary | ICD-10-CM

## 2022-02-06 DIAGNOSIS — D120 Benign neoplasm of cecum: Secondary | ICD-10-CM | POA: Diagnosis not present

## 2022-02-06 DIAGNOSIS — Z1211 Encounter for screening for malignant neoplasm of colon: Secondary | ICD-10-CM | POA: Insufficient documentation

## 2022-02-06 DIAGNOSIS — K219 Gastro-esophageal reflux disease without esophagitis: Secondary | ICD-10-CM | POA: Diagnosis not present

## 2022-02-06 DIAGNOSIS — K5730 Diverticulosis of large intestine without perforation or abscess without bleeding: Secondary | ICD-10-CM | POA: Diagnosis not present

## 2022-02-06 DIAGNOSIS — D123 Benign neoplasm of transverse colon: Secondary | ICD-10-CM | POA: Insufficient documentation

## 2022-02-06 DIAGNOSIS — I252 Old myocardial infarction: Secondary | ICD-10-CM | POA: Diagnosis not present

## 2022-02-06 HISTORY — PX: POLYPECTOMY: SHX5525

## 2022-02-06 HISTORY — PX: COLONOSCOPY WITH PROPOFOL: SHX5780

## 2022-02-06 SURGERY — COLONOSCOPY WITH PROPOFOL
Anesthesia: Monitor Anesthesia Care

## 2022-02-06 MED ORDER — SODIUM CHLORIDE 0.9 % IV SOLN: INTRAVENOUS | Status: DC

## 2022-02-06 MED ORDER — PROPOFOL 1000 MG/100ML IV EMUL
INTRAVENOUS | Status: AC
  Filled 2022-02-06: qty 100

## 2022-02-06 MED ORDER — PROPOFOL 10 MG/ML IV BOLUS
INTRAVENOUS | Status: DC | PRN
  Administered 2022-02-06: 40 mg via INTRAVENOUS
  Administered 2022-02-06: 30 mg via INTRAVENOUS
  Administered 2022-02-06: 20 mg via INTRAVENOUS
  Administered 2022-02-06: 40 mg via INTRAVENOUS

## 2022-02-06 MED ORDER — PROPOFOL 500 MG/50ML IV EMUL
INTRAVENOUS | Status: DC | PRN
  Administered 2022-02-06: 125 ug/kg/min via INTRAVENOUS

## 2022-02-06 MED ORDER — LACTATED RINGERS IV SOLN
INTRAVENOUS | Status: DC
  Administered 2022-02-06: 1000 mL via INTRAVENOUS

## 2022-02-06 MED ORDER — LIDOCAINE 2% (20 MG/ML) 5 ML SYRINGE
INTRAMUSCULAR | Status: DC | PRN
  Administered 2022-02-06: 80 mg via INTRAVENOUS

## 2022-02-06 SURGICAL SUPPLY — 22 items

## 2022-02-06 NOTE — Op Note (Signed)
[facility] Patient Name: Christian Powell Procedure Date: 02/06/2022 MRN: [id number] Attending MD: Carol Ada , MD Date of Birth: 1956-03-02 CSN: [id number] Age: 66 Admit Type: Outpatient Procedure:                Colonoscopy Indications:              High risk colon cancer surveillance: Personal                            history of colonic polyps Providers:                Carol Ada, MD, Jeanella Cara, RN, [facility] Technician, Technician Referring MD:              Medicines:                Propofol per Anesthesia Complications:            No immediate complications. Estimated Blood Loss:     Estimated blood loss: none. Procedure:                Pre-Anesthesia Assessment:                           - Prior to the procedure, a History and Physical                            was performed, and patient medications and                            allergies were reviewed. The patient's tolerance of                            previous anesthesia was also reviewed. The risks                            and benefits of the procedure and the sedation                            options and risks were discussed with the patient.                            All questions were answered, and informed consent                            was obtained. Prior Anticoagulants: The patient has                            taken no previous anticoagulant or antiplatelet                            agents. ASA Grade Assessment: III - A patient with                            severe  systemic disease. After reviewing the risks                            and benefits, the patient was deemed in                            satisfactory condition to undergo the procedure.                           - Sedation was administered by an anesthesia                            professional. Deep sedation was attained.                           After obtaining informed  consent, the colonoscope                            was passed under direct vision. Throughout the                            procedure, the patient's blood pressure, pulse, and                            oxygen saturations were monitored continuously. The                            CF-HQ190L ([id number] Olympus colonoscope was                            introduced through the anus and advanced to the the                            cecum, identified by appendiceal orifice and                            ileocecal valve. The colonoscopy was performed                            without difficulty. The patient tolerated the                            procedure well. The quality of the bowel                            preparation was evaluated using the BBPS [facility]                            Bowel Preparation Scale) with scores of: Right                            Colon = 3 (entire mucosa seen well with no residual  staining, small fragments of stool or opaque                            liquid), Transverse Colon = 3 (entire mucosa seen                            well with no residual staining, small fragments of                            stool or opaque liquid) and Left Colon = 3 (entire                            mucosa seen well with no residual staining, small                            fragments of stool or opaque liquid). The total                            BBPS score equals 9. The quality of the bowel                            preparation was good. The ileocecal valve,                            appendiceal orifice, and rectum were photographed. Scope In: 9:15:13 AM Scope Out: 9:31:43 AM Scope Withdrawal Time: 0 hours 13 minutes 43 seconds  Total Procedure Duration: 0 hours 16 minutes 30 seconds  Findings:      Four sessile polyps were found in the transverse colon and cecum. The       polyps were 3 to 4 mm in size. These polyps were removed with a cold        snare. Resection and retrieval were complete.      Scattered small and large-mouthed diverticula were found in the sigmoid       colon. Impression:               - Four 3 to 4 mm polyps in the transverse colon and                            in the cecum, removed with a cold snare. Resected                            and retrieved.                           - Diverticulosis in the sigmoid colon. Moderate Sedation:      Not Applicable - Patient had care per Anesthesia. Recommendation:           - Patient has a contact number available for                            emergencies. The signs and symptoms of potential  delayed complications were discussed with the                            patient. Return to normal activities tomorrow.                            Written discharge instructions were provided to the                            patient.                           - Resume previous diet.                           - Continue present medications.                           - Await pathology results.                           - Repeat colonoscopy in 5 years for surveillance. Procedure Code(s):        --- Professional ---                           [phone], Colonoscopy, flexible; with removal of                            tumor(s), polyp(s), or other lesion(s) by snare                            technique Diagnosis Code(s):        --- Professional ---                           K63.5, Polyp of colon                           Z86.010, Personal history of colonic polyps                           K57.30, Diverticulosis of large intestine without                            perforation or abscess without bleeding CPT copyright 2019 [employer]. All rights reserved. The codes documented in this report are preliminary and upon coder review may  be revised to meet current compliance requirements. Carol Ada, MD Carol Ada, MD 02/06/2022 9:41:22  AM This report has been signed electronically. Number of Addenda: 0

## 2022-02-06 NOTE — H&P (Signed)
Christian Powell HPI: The patient's colonoscopy on 11/04/2018 showed 6 adenomas ranging in size from 6-10 mm.  Overall he is well and he intentionally lost weight.  He is back working after the loss of his wife Christian Powell rectal SCC.   Past Medical History:  Diagnosis Date   High cholesterol    Labral tear of shoulder, left, subsequent encounter    MI (myocardial infarction) [facility])     Past Surgical History:  Procedure Laterality Date   CARDIAC CATHETERIZATION     COLONOSCOPY WITH PROPOFOL N/A 11/04/2018   Procedure: COLONOSCOPY WITH PROPOFOL;  Surgeon: Christian Ada, MD;  Location: [facility];  Service: Endoscopy;  Laterality: N/A;   ESOPHAGOGASTRODUODENOSCOPY (EGD) WITH PROPOFOL N/A 11/04/2018   Procedure: ESOPHAGOGASTRODUODENOSCOPY (EGD) WITH PROPOFOL;  Surgeon: Christian Ada, MD;  Location: [facility];  Service: Endoscopy;  Laterality: N/A;   FRACTURE SURGERY     LYSIS OF ADHESION Left 03/06/2019   Procedure: MANIPULATION WITH LYSIS OF ADHESION;  Surgeon: Christian Saas, MD;  Location: [place];  Service: Orthopedics;  Laterality: Left;   POLYPECTOMY  11/04/2018   Procedure: POLYPECTOMY;  Surgeon: Christian Ada, MD;  Location: [facility];  Service: Endoscopy;;   SHOULDER ARTHROSCOPY WITH DEBRIDEMENT AND BICEP TENDON REPAIR Left 03/06/2019   Procedure: SHOULDER ARTHROSCOPY WITH DEBRIDEMENT AND BICEP TENDODESIS;  Surgeon: Christian Saas, MD;  Location: [place];  Service: Orthopedics;  Laterality: Left;    History reviewed. No pertinent family history.  Social History:  reports that he has quit smoking. He has never used smokeless tobacco. He reports current alcohol use of about 4.0 standard drinks of alcohol per week. He reports that he does not use drugs.  Allergies: No Known Allergies  Medications: Scheduled: Continuous:  sodium chloride     lactated ringers 1,000 mL (02/06/22 0843)    No results found for this or any previous visit (from the  past 24 hour(s)).   No results found.  ROS:  As stated above in the HPI otherwise negative.  Blood pressure (!) 142/76, pulse 63, temperature 98 F (36.7 C), temperature source Oral, resp. rate 20, height '6\' 3"'$  (1.905 m), weight 94.8 kg, SpO2 96 %.    PE: Gen: NAD, Alert and Oriented HEENT:  [place]/AT, EOMI Neck: Supple, no LAD Lungs: CTA Bilaterally CV: RRR without M/G/R ABD: Soft, NTND, +BS Ext: No C/C/E  Assessment/Plan: 1) Personal history of polyps - colonoscopy.  Christian Powell D 02/06/2022, 8:56 AM

## 2022-02-06 NOTE — Transfer of Care (Signed)
Immediate Anesthesia Transfer of Care Note  Patient: Christian Powell  Procedure(s) Performed: COLONOSCOPY WITH PROPOFOL POLYPECTOMY  Patient Location: PACU  Anesthesia Type:MAC  Level of Consciousness: awake, alert  and oriented  Airway & Oxygen Therapy: Patient Spontanous Breathing and Patient connected to face mask oxygen  Post-op Assessment: Report given to RN and Post -op Vital signs reviewed and stable  Post vital signs: Reviewed and stable  Last Vitals:  Vitals Value Taken Time  BP    Temp    Pulse 55 02/06/22 0939  Resp 14 02/06/22 0939  SpO2 100 % 02/06/22 0939  Vitals shown include unvalidated device data.  Last Pain:  Vitals:   02/06/22 0836  TempSrc:   PainSc: 0-No pain         Complications: No notable events documented.

## 2022-02-06 NOTE — Discharge Instructions (Signed)

## 2022-02-06 NOTE — Anesthesia Postprocedure Evaluation (Signed)
Anesthesia Post Note  Patient: Christian Powell  Procedure(s) Performed: COLONOSCOPY WITH PROPOFOL POLYPECTOMY     Patient location during evaluation: PACU Anesthesia Type: MAC Level of consciousness: awake and alert Pain management: pain level controlled Vital Signs Assessment: post-procedure vital signs reviewed and stable Respiratory status: spontaneous breathing, nonlabored ventilation and respiratory function stable Cardiovascular status: blood pressure returned to baseline and stable Postop Assessment: no apparent nausea or vomiting Anesthetic complications: no   No notable events documented.  Last Vitals:  Vitals:   02/06/22 0940 02/06/22 0950  BP: (!) 108/43 (!) 117/53  Pulse: (!) 55 (!) 55  Resp: 14 14  Temp: 36.7 C   SpO2: 100% 99%    Last Pain:  Vitals:   02/06/22 0950  TempSrc:   PainSc: 0-No pain                 Pervis Hocking

## 2022-02-09 ENCOUNTER — Encounter (HOSPITAL_COMMUNITY): Payer: Self-pay | Admitting: Gastroenterology

## 2022-02-09 LAB — SURGICAL PATHOLOGY
